# Patient Record
Sex: Female | Born: 1935 | Race: Black or African American | Hispanic: No | Marital: Married | Smoking: Former smoker
Health system: Southern US, Community
[De-identification: ages and names within clinical notes are randomized; demographics above are authoritative.]

## PROBLEM LIST (undated history)

## (undated) DIAGNOSIS — Z8719 Personal history of other diseases of the digestive system: Secondary | ICD-10-CM

## (undated) DIAGNOSIS — L84 Corns and callosities: Secondary | ICD-10-CM

## (undated) DIAGNOSIS — I639 Cerebral infarction, unspecified: Secondary | ICD-10-CM

## (undated) DIAGNOSIS — R040 Epistaxis: Secondary | ICD-10-CM

## (undated) DIAGNOSIS — R5381 Other malaise: Secondary | ICD-10-CM

## (undated) DIAGNOSIS — K219 Gastro-esophageal reflux disease without esophagitis: Secondary | ICD-10-CM

## (undated) DIAGNOSIS — R269 Unspecified abnormalities of gait and mobility: Secondary | ICD-10-CM

## (undated) DIAGNOSIS — R5383 Other fatigue: Secondary | ICD-10-CM

## (undated) DIAGNOSIS — M1990 Unspecified osteoarthritis, unspecified site: Secondary | ICD-10-CM

## (undated) DIAGNOSIS — F411 Generalized anxiety disorder: Secondary | ICD-10-CM

## (undated) DIAGNOSIS — I4891 Unspecified atrial fibrillation: Secondary | ICD-10-CM

## (undated) DIAGNOSIS — E785 Hyperlipidemia, unspecified: Secondary | ICD-10-CM

## (undated) DIAGNOSIS — I10 Essential (primary) hypertension: Secondary | ICD-10-CM

## (undated) DIAGNOSIS — R0602 Shortness of breath: Secondary | ICD-10-CM

## (undated) HISTORY — DX: Hyperlipidemia, unspecified: E78.5

## (undated) HISTORY — DX: Generalized anxiety disorder: F41.1

## (undated) HISTORY — DX: Corns and callosities: L84

## (undated) HISTORY — DX: Other fatigue: R53.83

## (undated) HISTORY — DX: Essential (primary) hypertension: I10

## (undated) HISTORY — DX: Shortness of breath: R06.02

## (undated) HISTORY — DX: Other malaise: R53.81

## (undated) HISTORY — DX: Personal history of other diseases of the digestive system: Z87.19

## (undated) HISTORY — DX: Epistaxis: R04.0

## (undated) HISTORY — DX: Gastro-esophageal reflux disease without esophagitis: K21.9

## (undated) HISTORY — PX: ABDOMINAL HYSTERECTOMY: SHX81

## (undated) HISTORY — DX: Unspecified atrial fibrillation: I48.91

## (undated) HISTORY — PX: ESOPHAGOGASTRODUODENOSCOPY: SHX1529

## (undated) HISTORY — DX: Unspecified osteoarthritis, unspecified site: M19.90

## (undated) HISTORY — DX: Unspecified abnormalities of gait and mobility: R26.9

---

## 2005-01-07 ENCOUNTER — Ambulatory Visit: Payer: Self-pay | Admitting: Internal Medicine

## 2005-01-14 ENCOUNTER — Ambulatory Visit: Payer: Self-pay | Admitting: Internal Medicine

## 2005-02-11 ENCOUNTER — Ambulatory Visit: Payer: Self-pay | Admitting: Internal Medicine

## 2005-05-11 ENCOUNTER — Ambulatory Visit: Payer: Self-pay | Admitting: Internal Medicine

## 2005-05-21 ENCOUNTER — Ambulatory Visit: Payer: Self-pay

## 2005-08-11 ENCOUNTER — Ambulatory Visit: Payer: Self-pay | Admitting: Internal Medicine

## 2005-11-10 ENCOUNTER — Ambulatory Visit: Payer: Self-pay | Admitting: Internal Medicine

## 2005-12-08 ENCOUNTER — Ambulatory Visit: Payer: Self-pay | Admitting: Internal Medicine

## 2006-03-08 ENCOUNTER — Ambulatory Visit: Payer: Self-pay | Admitting: Internal Medicine

## 2006-04-29 ENCOUNTER — Ambulatory Visit: Payer: Self-pay | Admitting: Internal Medicine

## 2006-08-09 ENCOUNTER — Ambulatory Visit: Payer: Self-pay | Admitting: Internal Medicine

## 2007-01-23 ENCOUNTER — Ambulatory Visit: Payer: Self-pay | Admitting: Internal Medicine

## 2007-02-13 ENCOUNTER — Ambulatory Visit: Payer: Self-pay | Admitting: Internal Medicine

## 2007-02-13 LAB — CONVERTED CEMR LAB
AST: 22 units/L (ref 0–37)
Albumin: 3.3 g/dL — ABNORMAL LOW (ref 3.5–5.2)
Basophils Relative: 0.6 % (ref 0.0–1.0)
Bilirubin, Direct: 0.1 mg/dL (ref 0.0–0.3)
Chloride: 102 meq/L (ref 96–112)
Creatinine, Ser: 0.7 mg/dL (ref 0.4–1.2)
Eosinophils Relative: 2.7 % (ref 0.0–5.0)
GFR calc non Af Amer: 84 mL/min
HCT: 39.8 % (ref 36.0–46.0)
MCHC: 34.2 g/dL (ref 30.0–36.0)
Monocytes Relative: 10.9 % (ref 3.0–11.0)
Neutro Abs: 2.2 10*3/uL (ref 1.4–7.7)
Platelets: 176 10*3/uL (ref 150–400)
Potassium: 4.3 meq/L (ref 3.5–5.1)
RBC: 4.43 M/uL (ref 3.87–5.11)
Sodium: 143 meq/L (ref 135–145)
TSH: 1.51 microintl units/mL (ref 0.35–5.50)
Total Bilirubin: 0.6 mg/dL (ref 0.3–1.2)

## 2007-04-17 ENCOUNTER — Ambulatory Visit: Payer: Self-pay | Admitting: Internal Medicine

## 2007-05-17 ENCOUNTER — Ambulatory Visit: Payer: Self-pay | Admitting: Internal Medicine

## 2007-08-08 DIAGNOSIS — K219 Gastro-esophageal reflux disease without esophagitis: Secondary | ICD-10-CM

## 2007-08-08 DIAGNOSIS — I10 Essential (primary) hypertension: Secondary | ICD-10-CM

## 2007-08-08 HISTORY — DX: Gastro-esophageal reflux disease without esophagitis: K21.9

## 2007-08-08 HISTORY — DX: Essential (primary) hypertension: I10

## 2007-08-18 ENCOUNTER — Ambulatory Visit: Payer: Self-pay | Admitting: Internal Medicine

## 2007-08-18 DIAGNOSIS — R0602 Shortness of breath: Secondary | ICD-10-CM

## 2007-08-18 HISTORY — DX: Shortness of breath: R06.02

## 2007-08-18 LAB — CONVERTED CEMR LAB
CO2: 32 meq/L (ref 19–32)
Calcium: 9.6 mg/dL (ref 8.4–10.5)
Creatinine, Ser: 0.7 mg/dL (ref 0.4–1.2)
GFR calc Af Amer: 102 mL/min
Glucose, Bld: 90 mg/dL (ref 70–99)
Potassium: 4.8 meq/L (ref 3.5–5.1)
Pro B Natriuretic peptide (BNP): 62 pg/mL (ref 0.0–100.0)

## 2007-09-08 ENCOUNTER — Ambulatory Visit: Payer: Self-pay | Admitting: Internal Medicine

## 2007-09-29 ENCOUNTER — Ambulatory Visit: Payer: Self-pay | Admitting: Internal Medicine

## 2007-10-13 ENCOUNTER — Telehealth: Payer: Self-pay | Admitting: Internal Medicine

## 2007-12-04 ENCOUNTER — Ambulatory Visit: Payer: Self-pay | Admitting: Internal Medicine

## 2008-02-19 ENCOUNTER — Ambulatory Visit: Payer: Self-pay | Admitting: Internal Medicine

## 2008-02-19 DIAGNOSIS — M79609 Pain in unspecified limb: Secondary | ICD-10-CM

## 2008-05-17 ENCOUNTER — Ambulatory Visit: Payer: Self-pay | Admitting: Internal Medicine

## 2008-05-17 DIAGNOSIS — M1990 Unspecified osteoarthritis, unspecified site: Secondary | ICD-10-CM

## 2008-05-17 DIAGNOSIS — F411 Generalized anxiety disorder: Secondary | ICD-10-CM | POA: Insufficient documentation

## 2008-05-17 DIAGNOSIS — R110 Nausea: Secondary | ICD-10-CM

## 2008-05-17 DIAGNOSIS — R5383 Other fatigue: Secondary | ICD-10-CM

## 2008-05-17 DIAGNOSIS — R5381 Other malaise: Secondary | ICD-10-CM

## 2008-05-17 DIAGNOSIS — E785 Hyperlipidemia, unspecified: Secondary | ICD-10-CM

## 2008-05-17 HISTORY — DX: Other malaise: R53.81

## 2008-05-17 HISTORY — DX: Generalized anxiety disorder: F41.1

## 2008-05-17 HISTORY — DX: Hyperlipidemia, unspecified: E78.5

## 2008-05-17 HISTORY — DX: Unspecified osteoarthritis, unspecified site: M19.90

## 2008-05-21 ENCOUNTER — Ambulatory Visit: Payer: Self-pay | Admitting: Internal Medicine

## 2008-05-21 LAB — CONVERTED CEMR LAB
AST: 17 units/L (ref 0–37)
Alkaline Phosphatase: 96 units/L (ref 39–117)
Basophils Relative: 0.2 % (ref 0.0–1.0)
Calcium: 9.4 mg/dL (ref 8.4–10.5)
Creatinine, Ser: 0.7 mg/dL (ref 0.4–1.2)
Eosinophils Absolute: 0 10*3/uL (ref 0.0–0.7)
Eosinophils Relative: 0.6 % (ref 0.0–5.0)
GFR calc Af Amer: 101 mL/min
GFR calc non Af Amer: 84 mL/min
Hemoglobin: 13.7 g/dL (ref 12.0–15.0)
MCHC: 32.7 g/dL (ref 30.0–36.0)
MCV: 93.3 fL (ref 78.0–100.0)
Monocytes Absolute: 0.3 10*3/uL (ref 0.1–1.0)
Neutro Abs: 2.5 10*3/uL (ref 1.4–7.7)
RBC: 4.51 M/uL (ref 3.87–5.11)
RDW: 14 % (ref 11.5–14.6)
Total Protein: 7 g/dL (ref 6.0–8.3)

## 2008-05-23 ENCOUNTER — Telehealth: Payer: Self-pay | Admitting: Internal Medicine

## 2008-08-16 ENCOUNTER — Ambulatory Visit: Payer: Self-pay | Admitting: Internal Medicine

## 2008-08-16 DIAGNOSIS — R269 Unspecified abnormalities of gait and mobility: Secondary | ICD-10-CM

## 2008-08-16 HISTORY — DX: Unspecified abnormalities of gait and mobility: R26.9

## 2008-09-26 HISTORY — PX: OTHER SURGICAL HISTORY: SHX169

## 2008-10-01 ENCOUNTER — Ambulatory Visit: Payer: Self-pay | Admitting: Internal Medicine

## 2008-10-17 ENCOUNTER — Telehealth: Payer: Self-pay | Admitting: Internal Medicine

## 2008-10-25 ENCOUNTER — Inpatient Hospital Stay (HOSPITAL_COMMUNITY): Admission: EM | Admit: 2008-10-25 | Discharge: 2008-10-30 | Payer: Self-pay | Admitting: Emergency Medicine

## 2008-10-25 ENCOUNTER — Ambulatory Visit: Payer: Self-pay | Admitting: Internal Medicine

## 2008-10-25 ENCOUNTER — Encounter: Payer: Self-pay | Admitting: Internal Medicine

## 2009-02-11 ENCOUNTER — Ambulatory Visit: Payer: Self-pay | Admitting: Internal Medicine

## 2009-02-11 DIAGNOSIS — L84 Corns and callosities: Secondary | ICD-10-CM | POA: Insufficient documentation

## 2009-02-11 HISTORY — DX: Corns and callosities: L84

## 2009-02-11 LAB — CONVERTED CEMR LAB
ALT: 8 units/L (ref 0–35)
AST: 18 units/L (ref 0–37)
Albumin: 3.5 g/dL (ref 3.5–5.2)
BUN: 14 mg/dL (ref 6–23)
Bilirubin, Direct: 0.1 mg/dL (ref 0.0–0.3)
Calcium: 9.2 mg/dL (ref 8.4–10.5)
Creatinine, Ser: 0.8 mg/dL (ref 0.4–1.2)
Eosinophils Absolute: 0.1 10*3/uL (ref 0.0–0.7)
GFR calc Af Amer: 87 mL/min
GFR calc non Af Amer: 72 mL/min
Neutro Abs: 2.3 10*3/uL (ref 1.4–7.7)
Neutrophils Relative %: 57.1 % (ref 43.0–77.0)
Platelets: 137 10*3/uL — ABNORMAL LOW (ref 150–400)
RDW: 14 % (ref 11.5–14.6)
Sodium: 144 meq/L (ref 135–145)
TSH: 1.86 microintl units/mL (ref 0.35–5.50)
Total Protein: 7.1 g/dL (ref 6.0–8.3)

## 2009-04-22 IMAGING — CR DG FEMUR 2+V*R*
2 series · 2 of 2 positions shown · non-contrast
Comparison: Hip films same date

CLINICAL DATA: Trauma.

RIGHT FEMUR - 2 VIEW

[w hip lateral right]
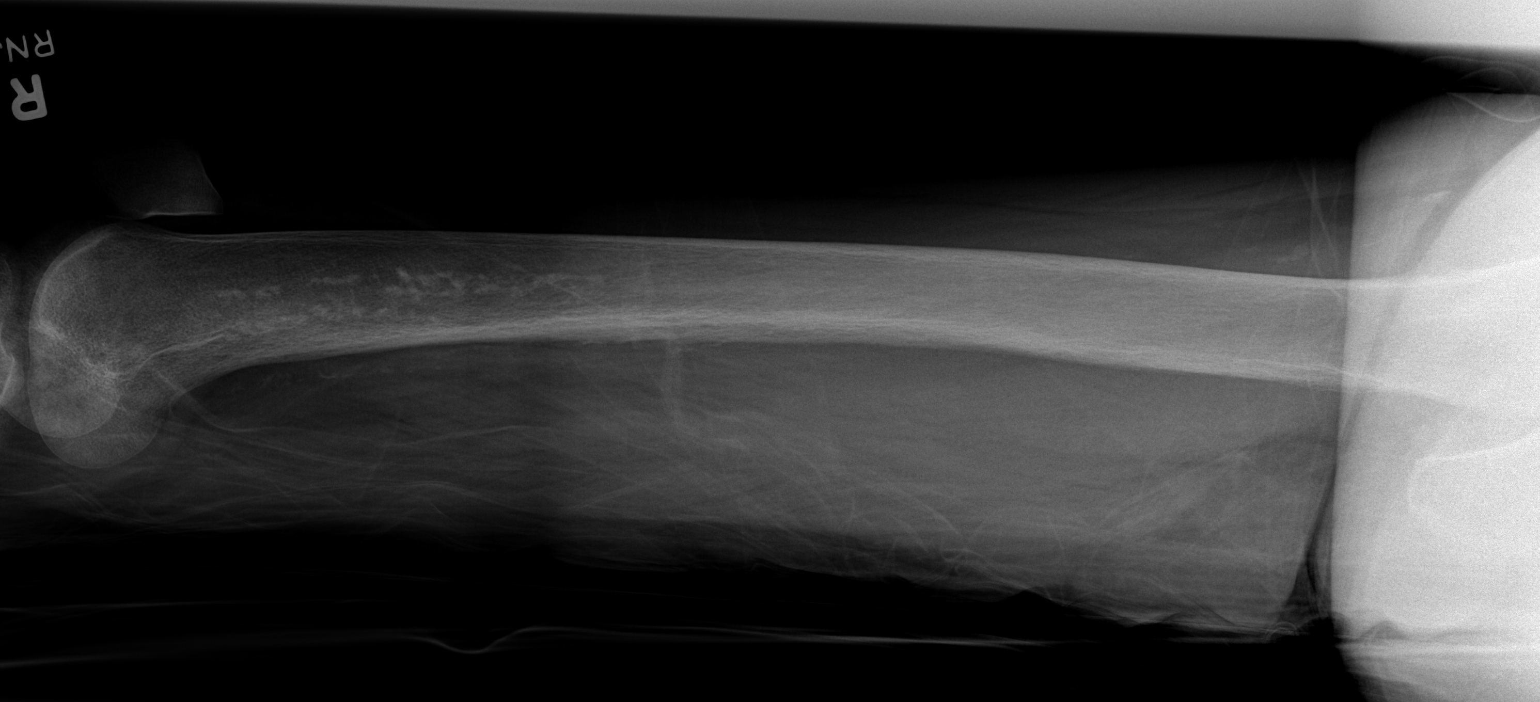

[t femur with knee ap right]
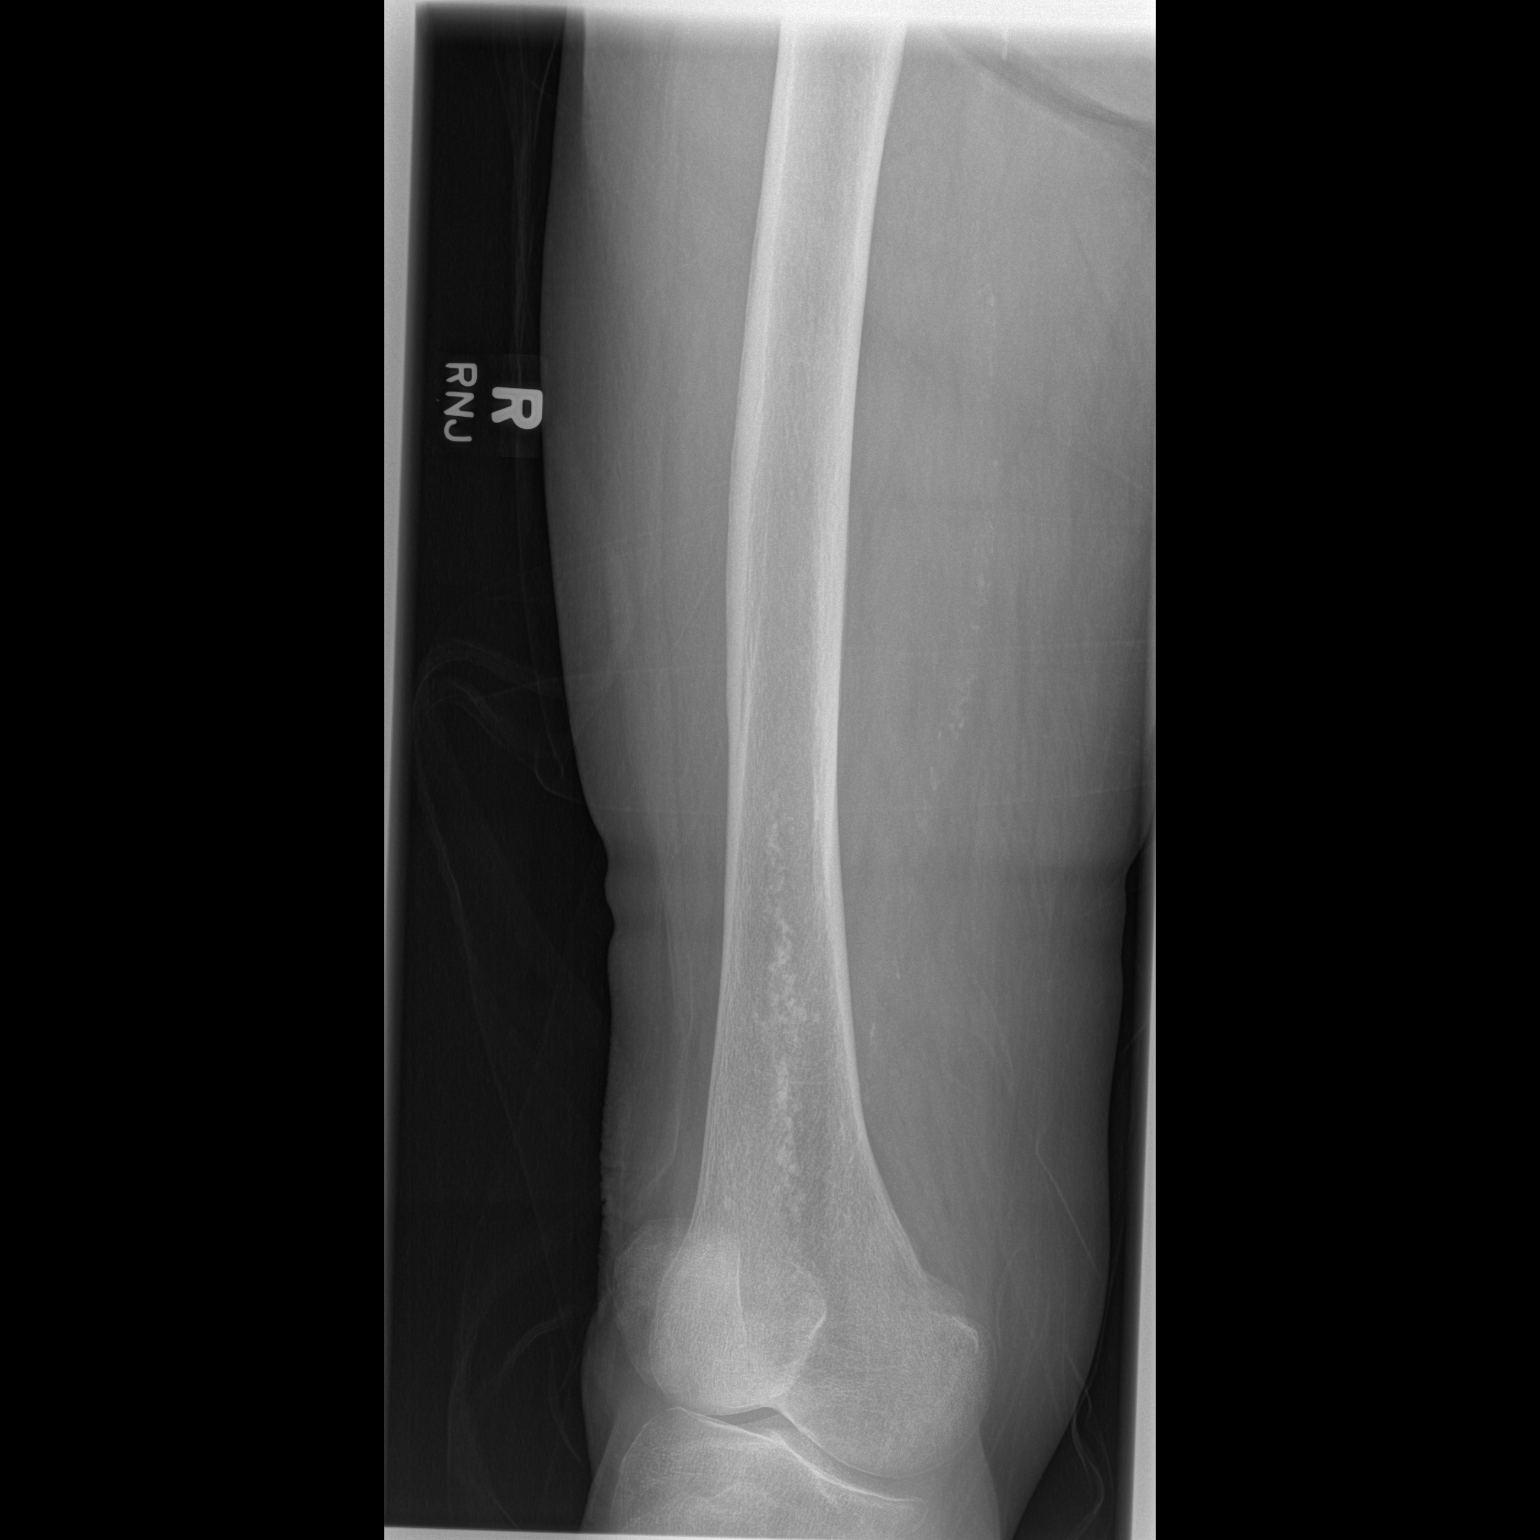

[2 of 2 positions shown; findings below may reference images not displayed]

FINDINGS: Vascular calcifications.  Mild osteopenia.  No mid to
distal femoral fracture.
IMPRESSION: 1.  No acute findings about the mid to distal right femur.  Please
see right hip films for further discussion.

## 2009-04-22 IMAGING — CR DG CHEST 1V
1 series · 1 of 1 positions shown · non-contrast
Comparison: 05/17/2008

CLINICAL DATA: Fall.  Hypertension.

CHEST - 1 VIEW

[t chest supine]
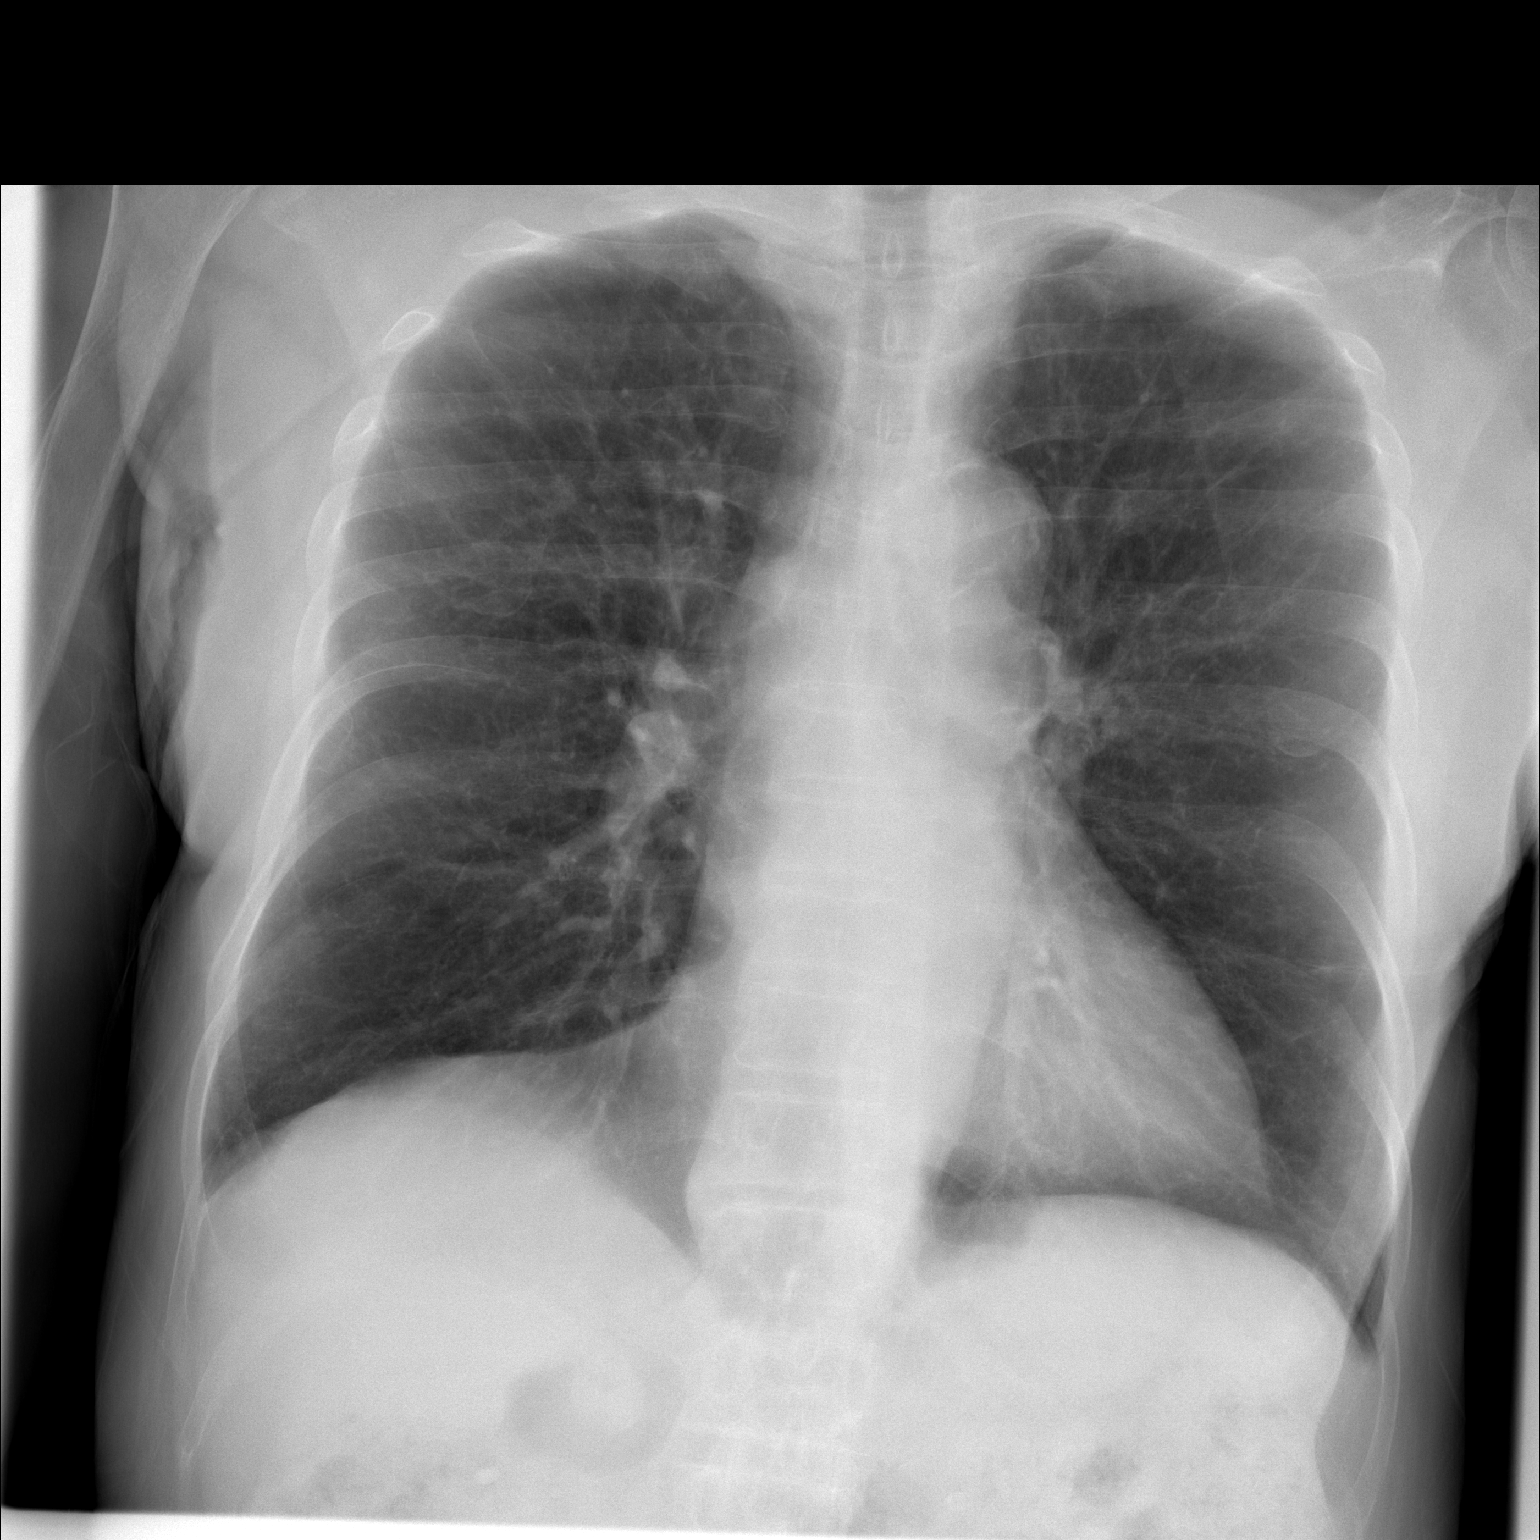

[1 of 1 positions shown; findings below may reference images not displayed]

FINDINGS: Mild osteopenia.  Apical lordotic technique. Midline
trachea. Heart size upper limits of normal.  Normal mediastinal
contours. No pleural effusion or pneumothorax. Question mild
hyperinflation. Clear lungs.
IMPRESSION: 1. No acute cardiopulmonary disease.

## 2009-08-01 ENCOUNTER — Ambulatory Visit: Payer: Self-pay | Admitting: Internal Medicine

## 2009-08-01 LAB — CONVERTED CEMR LAB
Glucose, Urine, Semiquant: NEGATIVE
Nitrite: NEGATIVE
Specific Gravity, Urine: 1.01
pH: 6.5

## 2009-10-09 ENCOUNTER — Ambulatory Visit: Payer: Self-pay | Admitting: Internal Medicine

## 2009-10-09 DIAGNOSIS — I4891 Unspecified atrial fibrillation: Secondary | ICD-10-CM

## 2009-10-09 DIAGNOSIS — N3000 Acute cystitis without hematuria: Secondary | ICD-10-CM | POA: Insufficient documentation

## 2009-10-09 DIAGNOSIS — R109 Unspecified abdominal pain: Secondary | ICD-10-CM | POA: Insufficient documentation

## 2009-10-09 HISTORY — DX: Unspecified atrial fibrillation: I48.91

## 2009-10-14 ENCOUNTER — Ambulatory Visit: Payer: Self-pay | Admitting: Internal Medicine

## 2010-01-07 ENCOUNTER — Emergency Department (HOSPITAL_COMMUNITY): Admission: EM | Admit: 2010-01-07 | Discharge: 2010-01-07 | Payer: Self-pay | Admitting: Emergency Medicine

## 2010-01-15 ENCOUNTER — Ambulatory Visit: Payer: Self-pay | Admitting: Internal Medicine

## 2010-01-15 DIAGNOSIS — R040 Epistaxis: Secondary | ICD-10-CM

## 2010-01-15 HISTORY — DX: Epistaxis: R04.0

## 2010-04-16 ENCOUNTER — Ambulatory Visit: Payer: Self-pay | Admitting: Internal Medicine

## 2010-10-19 ENCOUNTER — Ambulatory Visit: Payer: Self-pay | Admitting: Internal Medicine

## 2011-01-26 NOTE — Assessment & Plan Note (Signed)
Summary: fup up er-nosebleed//ccm   Vital Signs:  Patient profile:   77 year old female Weight:      115 pounds Temp:     97.6 degrees F oral BP sitting:   196 / 84  (left arm) Cuff size:   regular  Vitals Entered By: Raechel Ache, RN (January 15, 2010 10:52 AM) CC: ER f/u.   Primary Care Provider:  Gordy Savers  MD  CC:  ER f/u.Marland Kitchen  History of Present Illness: 77 year old patient who is seen today for follow-up.  She was evaluated at the [facility] originally due to epistaxis.  She has had two minor self-limiting episodes from the right nares.  Since her ER visit.  She misunderstood directions last visit when changes in her medical regimen were highlighted.  She has been taking the highlighted.  Medications only and has omitted.  Several blood pressure medications.  Denies any other concerns or complaints.  She does have a history of atrial fibrillation with rapid ventricular response.  Medical regimen includes aspirin only.  She has a history of dyslipidemia.  Allergies: No Known Drug Allergies  Past History:  Past Medical History: GERD: history of stricture formation Hypertension labile DJD Anxiety Osteoarthritis Hyperlipidemia gait instability atrial  fibrillation (onset October 2010)  Past Surgical History: Reviewed history from 02/11/2009 and no changes required. Hysterectomy gravida 5, para 5 abortus zero  2-D echocardiogram, May 2006 -normal esophagogastroduodenoscopy and 1983 with stricture formation, hiatal hernia and reflux esophagitis  status post right hip fracture October 2009  Review of Systems  The patient denies anorexia, fever, weight loss, weight gain, vision loss, decreased hearing, hoarseness, chest pain, syncope, dyspnea on exertion, peripheral edema, prolonged cough, headaches, hemoptysis, abdominal pain, melena, hematochezia, severe indigestion/heartburn, hematuria, incontinence, genital sores, muscle weakness, suspicious skin  lesions, transient blindness, difficulty walking, depression, unusual weight change, abnormal bleeding, enlarged lymph nodes, angioedema, and breast masses.    Physical Exam  General:  Well-developed,well-nourished,in no acute distress; alert,appropriate and cooperative throughout examination; blood pressure 190/86 Head:  Normocephalic and atraumatic without obvious abnormalities. No apparent alopecia or balding. Nose:  small crusted lesion involving her lateral right nares.  No active bleeding Mouth:  Oral mucosa and oropharynx without lesions or exudates.  Teeth in good repair. Neck:  No deformities, masses, or tenderness noted. Lungs:  Normal respiratory effort, chest expands symmetrically. Lungs are clear to auscultation, no crackles or wheezes. Heart:  Normal rate and regular rhythm. S1 and S2 normal without gallop, murmur, click, rub or other extra sounds.   Impression & Recommendations:  Problem # 1:  ATRIAL FIBRILLATION WITH RAPID VENTRICULAR RESPONSE (ICD-427.31)  Her updated medication list for this problem includes:    Amlodipine Besylate 5 Mg Tabs (Amlodipine besylate) ..... One daily    Aspirin 81 Mg Chew (Aspirin) .Marland Kitchen... 1 once daily    Metoprolol Tartrate 100 Mg Tabs (Metoprolol tartrate) ..... One twice daily  Problem # 2:  HYPERTENSION (ICD-401.9)  Her updated medication list for this problem includes:    Furosemide 40 Mg Tabs (Furosemide) ..... One daily    Benazepril Hcl 40 Mg Tabs (Benazepril hcl) ..... One daily    Amlodipine Besylate 5 Mg Tabs (Amlodipine besylate) ..... One daily    Metoprolol Tartrate 100 Mg Tabs (Metoprolol tartrate) ..... One twice daily  Problem # 3:  EPISTAXIS, RECURRENT (ICD-784.7)  Complete Medication List: 1)  Lorazepam 0.5 Mg Tabs (Lorazepam) .... Take 1 tablet by mouth once a day if needed 2)  Prevacid  30 Mg Cpdr (Lansoprazole) .... Take 1 tablet by mouth once a day if needed 3)  Furosemide 40 Mg Tabs (Furosemide) .... One daily 4)   Benazepril Hcl 40 Mg Tabs (Benazepril hcl) .... One daily 5)  Amlodipine Besylate 5 Mg Tabs (Amlodipine besylate) .... One daily 6)  Proair Hfa 108 (90 Base) Mcg/act Aers (Albuterol sulfate) .... 2 puffs every 6 hours as needed 7)  Aspirin 81 Mg Chew (Aspirin) .Marland Kitchen.. 1 once daily 8)  Ciprofloxacin Hcl 500 Mg Tabs (Ciprofloxacin hcl) .... One twice daily 9)  Metoprolol Tartrate 100 Mg Tabs (Metoprolol tartrate) .... One twice daily  Patient Instructions: 1)  Please schedule a follow-up appointment in 3 months. 2)  Limit your Sodium (Salt). 3)  It is important that you exercise regularly at least 20 minutes 5 times a week. If you develop chest pain, have severe difficulty breathing, or feel very tired , stop exercising immediately and seek medical attention. Prescriptions: METOPROLOL TARTRATE 100 MG TABS (METOPROLOL TARTRATE) one twice daily  #180 x 6   Entered and Authorized by:   Gordy Savers  MD   Signed by:   Gordy Savers  MD on 01/15/2010   Method used:   Print then Give to Patient   RxID:   [id number] PROAIR HFA 108 (90 BASE) MCG/ACT  AERS (ALBUTEROL SULFATE) 2 puffs every 6 hours as needed  #3 x 6   Entered and Authorized by:   Gordy Savers  MD   Signed by:   Gordy Savers  MD on 01/15/2010   Method used:   Print then Give to Patient   RxID:   [id number] AMLODIPINE BESYLATE 5 MG  TABS (AMLODIPINE BESYLATE) one daily  #90 x 6   Entered and Authorized by:   Gordy Savers  MD   Signed by:   Gordy Savers  MD on 01/15/2010   Method used:   Print then Give to Patient   RxID:   [id number] BENAZEPRIL HCL 40 MG  TABS (BENAZEPRIL HCL) one daily  #90 x 6   Entered and Authorized by:   Gordy Savers  MD   Signed by:   Gordy Savers  MD on 01/15/2010   Method used:   Print then Give to Patient   RxID:   [id number] FUROSEMIDE 40 MG  TABS (FUROSEMIDE) one daily  #90 x 6   Entered and Authorized by:   Gordy Savers   MD   Signed by:   Gordy Savers  MD on 01/15/2010   Method used:   Print then Give to Patient   RxID:   [id number] PREVACID 30 MG  CPDR (LANSOPRAZOLE) Take 1 tablet by mouth once a day if needed  #50 x 4   Entered and Authorized by:   Gordy Savers  MD   Signed by:   Gordy Savers  MD on 01/15/2010   Method used:   Print then Give to Patient   RxID:   [id number] LORAZEPAM 0.5 MG  TABS (LORAZEPAM) Take 1 tablet by mouth once a day if needed  #50 x 4   Entered and Authorized by:   Gordy Savers  MD   Signed by:   Gordy Savers  MD on 01/15/2010   Method used:   Print then Give to Patient   RxID:   [id number]

## 2011-01-26 NOTE — Miscellaneous (Signed)
Summary: removed cipro  Clinical Lists Changes  Medications: Removed medication of CIPROFLOXACIN HCL 500 MG TABS (CIPROFLOXACIN HCL) one twice daily

## 2011-01-26 NOTE — Assessment & Plan Note (Signed)
Summary: Follow up/flu shot/cb   Vital Signs:  Patient profile:   77 year old female Height:      62 inches Weight:      116 pounds BMI:     21.29 Temp:     98.2 degrees F oral Pulse rate:   80 / minute Pulse rhythm:   irregular Resp:     14 per minute BP sitting:   180 / 80  (left arm)  Vitals Entered By: Willy Eddy, LPN (October 19, 2010 10:43 AM) CC: roa Is Patient Diabetic? No   Primary Care Sahithi Ordoyne:  Gordy Savers  MD  CC:  roa.  History of Present Illness: 77 year  old patient who is seen today for follow-up.  She has a history of  treated hypertension, and paroxysmal atrial fibrillation.  She does remarkably well at 77 years of age.  She is on multiple blood pressure medications.  Denies any palpitations or symptoms of congestive heart failure.  No chest pain. She has a history of gastroenteritis, which has been stable.  She has a history of gait instability  Preventive Screening-Counseling & Management  Alcohol-Tobacco     Smoking Status: quit     Passive Smoke Exposure: no  Current Problems (verified): 1)  Epistaxis, Recurrent  (ICD-784.7) 2)  Acute Cystitis  (ICD-595.0) 3)  Atrial Fibrillation With Rapid Ventricular Response  (ICD-427.31) 4)  Suprapubic Pain  (ICD-789.09) 5)  Callus, Right Foot  (ICD-700) 6)  Gait Imbalance  (ICD-781.2) 7)  Nausea  (ICD-787.02) 8)  Weakness  (ICD-780.79) 9)  Hyperlipidemia  (ICD-272.4) 10)  Osteoarthritis  (ICD-715.90) 11)  Anxiety  (ICD-300.00) 12)  Calf Pain, Bilateral  (ICD-729.5) 13)  Sob  (ICD-786.05) 14)  Hypertension  (ICD-401.9) 15)  Gerd  (ICD-530.81)  Allergies: No Known Drug Allergies  Past History:  Past Medical History: Reviewed history from 01/15/2010 and no changes required. GERD: history of stricture formation Hypertension labile DJD Anxiety Osteoarthritis Hyperlipidemia gait instability atrial  fibrillation (onset October 2010)  Past Surgical History: Reviewed history from  02/11/2009 and no changes required. Hysterectomy gravida 5, para 5 abortus zero  2-D echocardiogram, May 2006 -normal esophagogastroduodenoscopy and 1983 with stricture formation, hiatal hernia and reflux esophagitis  status post right hip fracture October 2009  Family History: Reviewed history from 05/17/2008 and no changes required. Family History Hypertension father died in his 95s, unclear cause.  Mother died at 72 bone the hip fracture   one sister no family history of cancer  Review of Systems       The patient complains of muscle weakness and difficulty walking.  The patient denies anorexia, fever, weight loss, weight gain, vision loss, decreased hearing, hoarseness, chest pain, syncope, dyspnea on exertion, peripheral edema, prolonged cough, headaches, hemoptysis, abdominal pain, melena, hematochezia, severe indigestion/heartburn, hematuria, incontinence, genital sores, suspicious skin lesions, transient blindness, depression, unusual weight change, abnormal bleeding, enlarged lymph nodes, angioedema, and breast masses.         Flu Vaccine Consent Questions     Do you have a history of severe allergic reactions to this vaccine? no    Any prior history of allergic reactions to egg and/or gelatin? no    Do you have a sensitivity to the preservative Thimersol? no    Do you have a past history of Guillan-Barre Syndrome? no    Do you currently have an acute febrile illness? no    Have you ever had a severe reaction to latex? no  Vaccine information given and explained to patient? yes    Are you currently pregnant? no    Lot Number:[id number]   Exp Date:06/26/2011   Site Given  Left Deltoid IM   Physical Exam  General:  Well-developed,well-nourished,in no acute distress; alert,appropriate and cooperative throughout examination; 160/74 Head:  Normocephalic and atraumatic without obvious abnormalities. No apparent alopecia or balding. Mouth:  Oral mucosa and oropharynx  without lesions or exudates.  Teeth in good repair. Neck:  No deformities, masses, or tenderness noted. Lungs:  Normal respiratory effort, chest expands symmetrically. Lungs are clear to auscultation, no crackles or wheezes. Heart:  occasional ectopics probable normal sinus rhythm rate 70 to 80 Abdomen:  Bowel sounds positive,abdomen soft and non-tender without masses, organomegaly or hernias noted. Msk:  No deformity or scoliosis noted of thoracic or lumbar spine.   osteoarthritic changes only Extremities:  .  No edema Skin:  Intact without suspicious lesions or rashes Cervical Nodes:  No lymphadenopathy noted   Impression & Recommendations:  Problem # 1:  HYPERTENSION (ICD-401.9)  Her updated medication list for this problem includes:    Furosemide 40 Mg Tabs (Furosemide) ..... One daily    Benazepril Hcl 40 Mg Tabs (Benazepril hcl) ..... One daily    Amlodipine Besylate 5 Mg Tabs (Amlodipine besylate) ..... One daily    Metoprolol Tartrate 100 Mg Tabs (Metoprolol tartrate) ..... One twice daily  Her updated medication list for this problem includes:    Furosemide 40 Mg Tabs (Furosemide) ..... One daily    Benazepril Hcl 40 Mg Tabs (Benazepril hcl) ..... One daily    Amlodipine Besylate 5 Mg Tabs (Amlodipine besylate) ..... One daily    Metoprolol Tartrate 100 Mg Tabs (Metoprolol tartrate) ..... One twice daily  Problem # 2:  OSTEOARTHRITIS (ICD-715.90)  Her updated medication list for this problem includes:    Aspirin 81 Mg Chew (Aspirin) .Marland Kitchen... 1 once daily  Her updated medication list for this problem includes:    Aspirin 81 Mg Chew (Aspirin) .Marland Kitchen... 1 once daily  Problem # 3:  ATRIAL FIBRILLATION WITH RAPID VENTRICULAR RESPONSE (ICD-427.31)  Her updated medication list for this problem includes:    Amlodipine Besylate 5 Mg Tabs (Amlodipine besylate) ..... One daily    Aspirin 81 Mg Chew (Aspirin) .Marland Kitchen... 1 once daily    Metoprolol Tartrate 100 Mg Tabs (Metoprolol tartrate)  ..... One twice daily  Her updated medication list for this problem includes:    Amlodipine Besylate 5 Mg Tabs (Amlodipine besylate) ..... One daily    Aspirin 81 Mg Chew (Aspirin) .Marland Kitchen... 1 once daily    Metoprolol Tartrate 100 Mg Tabs (Metoprolol tartrate) ..... One twice daily  patient probably is a normal sinus rhythm today, but does have frequent ectopics;  in view of her weakness and gait instability.  Doubtful candidate for full anticoagulation.  Will reassess in the spring  Complete Medication List: 1)  Lorazepam 0.5 Mg Tabs (Lorazepam) .... Take 1 tablet by mouth once a day if needed 2)  Prevacid 30 Mg Cpdr (Lansoprazole) .... Take 1 tablet by mouth once a day if needed 3)  Furosemide 40 Mg Tabs (Furosemide) .... One daily 4)  Benazepril Hcl 40 Mg Tabs (Benazepril hcl) .... One daily 5)  Amlodipine Besylate 5 Mg Tabs (Amlodipine besylate) .... One daily 6)  Proair Hfa 108 (90 Base) Mcg/act Aers (Albuterol sulfate) .... 2 puffs every 6 hours as needed 7)  Aspirin 81 Mg Chew (Aspirin) .Marland Kitchen.. 1 once daily 8)  Metoprolol Tartrate  100 Mg Tabs (Metoprolol tartrate) .... One twice daily  Other Orders: Flu Vaccine 87yrs + MEDICARE PATIENTS (F6213) Administration Flu vaccine - MCR (Y8657)  Patient Instructions: 1)  Please schedule a follow-up appointment in 6 months. 2)  Limit your Sodium (Salt). 3)  It is important that you exercise regularly at least 20 minutes 5 times a week. If you develop chest pain, have severe difficulty breathing, or feel very tired , stop exercising immediately and seek medical attention. and I will go R. and  Orders Added: 1)  Flu Vaccine 48yrs + MEDICARE PATIENTS [Q2039] 2)  Administration Flu vaccine - MCR [G0008] 3)  Est. Patient Level IV [84696]

## 2011-01-26 NOTE — Assessment & Plan Note (Signed)
Summary: 3 mo rov/mm   Vital Signs:  Patient profile:   77 year old female Weight:      116 pounds O2 Sat:      95 % on Room air Temp:     97.6 degrees F oral BP sitting:   140 / 90  (right arm) Cuff size:   regular  Vitals Entered By: Duard Brady LPN (April 16, 2010 10:02 AM)  O2 Flow:  Room air CC: 3 mos rov - doing well Is Patient Diabetic? No   Primary Care Provider:  Gordy Savers  MD  CC:  3 mos rov - doing well.  History of Present Illness: 77 year old patient has a history of atrial fibrillation.  She has done quite well.  The risks versus benefits of Coumadin therapy.  Again discussed, and it was felt that aspirin remains her best option.  She is doing quite well.  She has treated hypertension.  She has arthritis and gastroesophageal reflux disease.  She denies any cardiopulmonary complaints.  She states she has occasional mild wheezing that is well-controlled with pro air  Allergies (verified): No Known Drug Allergies  Past History:  Past Medical History: Reviewed history from 01/15/2010 and no changes required. GERD: history of stricture formation Hypertension labile DJD Anxiety Osteoarthritis Hyperlipidemia gait instability atrial  fibrillation (onset October 2010)  Family History: Reviewed history from 05/17/2008 and no changes required. Family History Hypertension father died in his 16s, unclear cause.  Mother died at 49 bone the hip fracture   one sister no family history of cancer  Review of Systems  The patient denies anorexia, fever, weight loss, weight gain, vision loss, decreased hearing, hoarseness, chest pain, syncope, dyspnea on exertion, peripheral edema, prolonged cough, headaches, hemoptysis, abdominal pain, melena, hematochezia, severe indigestion/heartburn, hematuria, incontinence, genital sores, muscle weakness, suspicious skin lesions, transient blindness, difficulty walking, depression, unusual weight change, abnormal  bleeding, enlarged lymph nodes, angioedema, and breast masses.    Physical Exam  General:  elderly alert, in no distress.  Blood pressure 140/82 Head:  Normocephalic and atraumatic without obvious abnormalities. No apparent alopecia or balding. Eyes:  No corneal or conjunctival inflammation noted. EOMI. Perrla. Funduscopic exam benign, without hemorrhages, exudates or papilledema. Vision grossly normal. Mouth:  Oral mucosa and oropharynx without lesions or exudates.  Teeth in good repair. Neck:  No deformities, masses, or tenderness noted. Lungs:  bibasilar crackles noted Heart:  irregular rhythm, with a controlled ventricular response Abdomen:  Bowel sounds positive,abdomen soft and non-tender without masses, organomegaly or hernias noted. Msk:  No deformity or scoliosis noted of thoracic or lumbar spine.   Extremities:  no edema   Impression & Recommendations:  Problem # 1:  ATRIAL FIBRILLATION WITH RAPID VENTRICULAR RESPONSE (ICD-427.31)  Her updated medication list for this problem includes:    Amlodipine Besylate 5 Mg Tabs (Amlodipine besylate) ..... One daily    Aspirin 81 Mg Chew (Aspirin) .Marland Kitchen... 1 once daily    Metoprolol Tartrate 100 Mg Tabs (Metoprolol tartrate) ..... One twice daily  Her updated medication list for this problem includes:    Amlodipine Besylate 5 Mg Tabs (Amlodipine besylate) ..... One daily    Aspirin 81 Mg Chew (Aspirin) .Marland Kitchen... 1 once daily    Metoprolol Tartrate 100 Mg Tabs (Metoprolol tartrate) ..... One twice daily  Problem # 2:  GAIT IMBALANCE (ICD-781.2)  Problem # 3:  OSTEOARTHRITIS (ICD-715.90)  Her updated medication list for this problem includes:    Aspirin 81 Mg Chew (Aspirin) .Marland KitchenMarland KitchenMarland KitchenMarland Kitchen 1  once daily  Her updated medication list for this problem includes:    Aspirin 81 Mg Chew (Aspirin) .Marland Kitchen... 1 once daily  Problem # 4:  HYPERTENSION (ICD-401.9)  Her updated medication list for this problem includes:    Furosemide 40 Mg Tabs (Furosemide)  ..... One daily    Benazepril Hcl 40 Mg Tabs (Benazepril hcl) ..... One daily    Amlodipine Besylate 5 Mg Tabs (Amlodipine besylate) ..... One daily    Metoprolol Tartrate 100 Mg Tabs (Metoprolol tartrate) ..... One twice daily  Her updated medication list for this problem includes:    Furosemide 40 Mg Tabs (Furosemide) ..... One daily    Benazepril Hcl 40 Mg Tabs (Benazepril hcl) ..... One daily    Amlodipine Besylate 5 Mg Tabs (Amlodipine besylate) ..... One daily    Metoprolol Tartrate 100 Mg Tabs (Metoprolol tartrate) ..... One twice daily  Complete Medication List: 1)  Lorazepam 0.5 Mg Tabs (Lorazepam) .... Take 1 tablet by mouth once a day if needed 2)  Prevacid 30 Mg Cpdr (Lansoprazole) .... Take 1 tablet by mouth once a day if needed 3)  Furosemide 40 Mg Tabs (Furosemide) .... One daily 4)  Benazepril Hcl 40 Mg Tabs (Benazepril hcl) .... One daily 5)  Amlodipine Besylate 5 Mg Tabs (Amlodipine besylate) .... One daily 6)  Proair Hfa 108 (90 Base) Mcg/act Aers (Albuterol sulfate) .... 2 puffs every 6 hours as needed 7)  Aspirin 81 Mg Chew (Aspirin) .Marland Kitchen.. 1 once daily 8)  Ciprofloxacin Hcl 500 Mg Tabs (Ciprofloxacin hcl) .... One twice daily 9)  Metoprolol Tartrate 100 Mg Tabs (Metoprolol tartrate) .... One twice daily  Patient Instructions: 1)  Please schedule a follow-up appointment in 6 months. 2)  Limit your Sodium (Salt). 3)  It is important that you exercise regularly at least 20 minutes 5 times a week. If you develop chest pain, have severe difficulty breathing, or feel very tired , stop exercising immediately and seek medical attention. Prescriptions: METOPROLOL TARTRATE 100 MG TABS (METOPROLOL TARTRATE) one twice daily  #180 x 6   Entered and Authorized by:   Gordy Savers  MD   Signed by:   Gordy Savers  MD on 04/16/2010   Method used:   Print then Give to Patient   RxID:   [id number] PROAIR HFA 108 (90 BASE) MCG/ACT  AERS (ALBUTEROL SULFATE) 2 puffs  every 6 hours as needed  #3 x 6   Entered and Authorized by:   Gordy Savers  MD   Signed by:   Gordy Savers  MD on 04/16/2010   Method used:   Print then Give to Patient   RxID:   [id number] AMLODIPINE BESYLATE 5 MG  TABS (AMLODIPINE BESYLATE) one daily  #90 x 6   Entered and Authorized by:   Gordy Savers  MD   Signed by:   Gordy Savers  MD on 04/16/2010   Method used:   Print then Give to Patient   RxID:   [id number] BENAZEPRIL HCL 40 MG  TABS (BENAZEPRIL HCL) one daily  #90 x 6   Entered and Authorized by:   Gordy Savers  MD   Signed by:   Gordy Savers  MD on 04/16/2010   Method used:   Print then Give to Patient   RxID:   [id number] FUROSEMIDE 40 MG  TABS (FUROSEMIDE) one daily  #90 x 6   Entered and Authorized by:   Gordy Savers  MD  Signed by:   Gordy Savers  MD on 04/16/2010   Method used:   Print then Give to Patient   RxID:   [id number] PREVACID 30 MG  CPDR (LANSOPRAZOLE) Take 1 tablet by mouth once a day if needed  #50 x 4   Entered and Authorized by:   Gordy Savers  MD   Signed by:   Gordy Savers  MD on 04/16/2010   Method used:   Print then Give to Patient   RxID:   [id number] LORAZEPAM 0.5 MG  TABS (LORAZEPAM) Take 1 tablet by mouth once a day if needed  #50 x 4   Entered and Authorized by:   Gordy Savers  MD   Signed by:   Gordy Savers  MD on 04/16/2010   Method used:   Print then Give to Patient   RxID:   [id number]

## 2011-04-16 ENCOUNTER — Encounter: Payer: Self-pay | Admitting: Internal Medicine

## 2011-04-21 ENCOUNTER — Ambulatory Visit: Payer: Self-pay | Admitting: Internal Medicine

## 2011-04-27 ENCOUNTER — Ambulatory Visit (INDEPENDENT_AMBULATORY_CARE_PROVIDER_SITE_OTHER): Payer: MEDICARE | Admitting: Internal Medicine

## 2011-04-27 ENCOUNTER — Encounter: Payer: Self-pay | Admitting: Internal Medicine

## 2011-04-27 DIAGNOSIS — M1990 Unspecified osteoarthritis, unspecified site: Secondary | ICD-10-CM

## 2011-04-27 DIAGNOSIS — I10 Essential (primary) hypertension: Secondary | ICD-10-CM

## 2011-04-27 DIAGNOSIS — E785 Hyperlipidemia, unspecified: Secondary | ICD-10-CM

## 2011-04-27 LAB — LIPID PANEL
Cholesterol: 162 mg/dL (ref 0–200)
HDL: 52.8 mg/dL (ref >39.00)
LDL Cholesterol: 98 mg/dL (ref 0–99)
Total CHOL/HDL Ratio: 3
Triglycerides: 54 mg/dL (ref 0.0–149.0)

## 2011-04-27 LAB — HEPATIC FUNCTION PANEL
ALT: 13 U/L (ref 0–35)
AST: 19 U/L (ref 0–37)
Albumin: 3.6 g/dL (ref 3.5–5.2)
Alkaline Phosphatase: 89 U/L (ref 39–117)
Bilirubin, Direct: 0.1 mg/dL (ref 0.0–0.3)
Total Bilirubin: 0.5 mg/dL (ref 0.3–1.2)

## 2011-04-27 LAB — CBC WITH DIFFERENTIAL/PLATELET
Basophils Absolute: 0 10*3/uL (ref 0.0–0.1)
Basophils Relative: 0.3 % (ref 0.0–3.0)
Eosinophils Absolute: 0.1 10*3/uL (ref 0.0–0.7)
Monocytes Absolute: 0.4 10*3/uL (ref 0.1–1.0)
Monocytes Relative: 8.3 % (ref 3.0–12.0)
Neutro Abs: 2.8 10*3/uL (ref 1.4–7.7)
Neutrophils Relative %: 62.9 % (ref 43.0–77.0)
RDW: 14.8 % — ABNORMAL HIGH (ref 11.5–14.6)
WBC: 4.5 10*3/uL (ref 4.5–10.5)

## 2011-04-27 LAB — BASIC METABOLIC PANEL
BUN: 12 mg/dL (ref 6–23)
CO2: 33 mEq/L — ABNORMAL HIGH (ref 19–32)
Chloride: 106 mEq/L (ref 96–112)

## 2011-04-27 MED ORDER — LORAZEPAM 0.5 MG PO TABS: 0.5000 mg | ORAL_TABLET | Freq: Every day | ORAL | Status: DC | PRN

## 2011-04-27 MED ORDER — FUROSEMIDE 40 MG PO TABS: 40.0000 mg | ORAL_TABLET | Freq: Every day | ORAL | Status: DC

## 2011-04-27 MED ORDER — BENAZEPRIL HCL 40 MG PO TABS: 40.0000 mg | ORAL_TABLET | Freq: Every day | ORAL | Status: DC

## 2011-04-27 MED ORDER — LANSOPRAZOLE 30 MG PO CPDR: 30.0000 mg | DELAYED_RELEASE_CAPSULE | Freq: Every day | ORAL | Status: DC | PRN

## 2011-04-27 MED ORDER — METOPROLOL TARTRATE 100 MG PO TABS: 100.0000 mg | ORAL_TABLET | Freq: Two times a day (BID) | ORAL | Status: DC

## 2011-04-27 MED ORDER — AMLODIPINE BESYLATE 5 MG PO TABS: 5.0000 mg | ORAL_TABLET | Freq: Every day | ORAL | Status: DC

## 2011-04-27 NOTE — Progress Notes (Signed)
  Subjective:    Patient ID: Cynthia Graham, female    DOB: 1917/01/28, 77 y.o.   MRN: [id number]  HPI  77 year old patient who is in today for followup. She has a history of osteoarthritis hypertension and dyslipidemia. She is on multiple blood pressure medications with nice control. Her only complaint today is some mild lower abdominal pain. There's been no dysuria pain is intermittent. She is unable to give a urine specimen today. She denies any cardiopulmonary complaints. Her weight has been stable. She does have a history of atrial fibrillation but denies any palpitations or any cardiopulmonary complaints.    Review of Systems  Constitutional: Negative.   HENT: Negative for hearing loss, congestion, sore throat, rhinorrhea, dental problem, sinus pressure and tinnitus.   Eyes: Negative for pain, discharge and visual disturbance.  Respiratory: Negative for cough and shortness of breath.   Cardiovascular: Negative for chest pain, palpitations and leg swelling.  Gastrointestinal: Negative for nausea, vomiting, abdominal pain, diarrhea, constipation, blood in stool and abdominal distention.  Genitourinary: Negative for dysuria, urgency, frequency, hematuria, flank pain, vaginal bleeding, vaginal discharge, difficulty urinating, vaginal pain and pelvic pain.  Musculoskeletal: Negative for joint swelling, arthralgias and gait problem.  Skin: Negative for rash.  Neurological: Negative for dizziness, syncope, speech difficulty, weakness, numbness and headaches.  Hematological: Negative for adenopathy.  Psychiatric/Behavioral: Negative for behavioral problems, dysphoric mood and agitation. The patient is not nervous/anxious.        Objective:   Physical Exam  Constitutional: She is oriented to person, place, and time. She appears well-developed and well-nourished.  HENT:  Head: Normocephalic.  Right Ear: External ear normal.  Left Ear: External ear normal.  Mouth/Throat: Oropharynx is clear and  moist.  Eyes: Conjunctivae and EOM are normal. Pupils are equal, round, and reactive to light.  Neck: Normal range of motion. Neck supple. No thyromegaly present.  Cardiovascular: Normal rate, regular rhythm and normal heart sounds.   Pulmonary/Chest: Effort normal and breath sounds normal.  Abdominal: Soft. Bowel sounds are normal. She exhibits no mass. There is no tenderness.  Musculoskeletal: Normal range of motion.  Lymphadenopathy:    She has no cervical adenopathy.  Neurological: She is alert and oriented to person, place, and time.  Skin: Skin is warm and dry. No rash noted.  Psychiatric: She has a normal mood and affect. Her behavior is normal.          Assessment & Plan:   Hypertension well controlled History of dyslipidemia Osteoarthritis  We'll check screening lab All medications refilled We'll recheck in 6 months

## 2011-04-27 NOTE — Patient Instructions (Signed)
Limit your sodium (Salt) intake  Return in 6 months for follow-up  

## 2011-04-27 NOTE — Progress Notes (Signed)
  Subjective:    Patient ID: Cynthia Graham, female    DOB: [birth year], 77 y.o.   MRN: [id number]  HPI  Wt Readings from Last 3 Encounters:  04/27/11 116 lb (52.617 kg)  10/19/10 116 lb (52.617 kg)  04/16/10 116 lb (52.617 kg)      Review of Systems     Objective:   Physical Exam        Assessment & Plan:

## 2011-05-11 NOTE — H&P (Signed)
Cynthia Graham, Cynthia Graham                  ACCOUNT NO.:  [id number]   MEDICAL RECORD NO.:  [id number]          PATIENT TYPE:  EMS   LOCATION:  ED                           FACILITY:  [facility]   PHYSICIAN:  Erasmo Leventhal, M.D.DATE OF BIRTH:  Sep 25, 1917   DATE OF ADMISSION:  10/25/2008  DATE OF DISCHARGE:                              HISTORY & PHYSICAL   CHIEF COMPLAINT:  Right hip fracture.   HISTORY OF PRESENT ILLNESS:  This 77 year old lady with a history of a  fall this evening tried to get into a car with pain and difficulty  ambulating on her right hip.  She was brought to [facility] emergency  room where she was seen and evaluated. X-rays showed a displaced femoral  neck fracture.  After discussion of the injury with the patient and her  family she will now be admitted for hemiarthroplasty of her right hip.  The surgery risks and benefits were discussed with the patient,  questions invited and answered.  Will obtain medical consult with  [facility] hospitalist for medical management of her hypertension  postoperatively.   PAST MEDICAL HISTORY:   DRUG ALLERGIES:  None.   CURRENT MEDICATIONS:  Metoprolol 50 mg b.i.d., lorazepam 0.5 mg b.i.d.  p.r.n. Prevacid 30 mg p.o. daily, Lasix 40 mg p.o. daily, benazepril 40  mg p.o. daily, amlodipine 5 mg p.o. daily, ProAir HFA 2 puffs q.6h.  p.r.n., and promethazine 25 mg p.o. q.6h. p.r.n. nausea.   Serious medical illnesses include hypertension and anxiety.  Previous  surgeries include hysterectomy.   FAMILY HISTORY:  Positive for hypertension.   SOCIAL HISTORY:  The patient is widowed.  She lives at home with her  daughter.  She previously smoked but quit 20 years ago and does not  smoke currently and not does not drink.   REVIEW OF SYSTEMS:  CENTRAL NERVOUS SYSTEM: Negative headache, blurred  vision, or dizziness.  PULMONARY:  Positive for exertional shortness of  breath. Negative for PND and orthopnea.  CARDIOVASCULAR:  Positive  for  history of occasional irregular heartbeat.  GI: Negative for ulcers,  hepatitis.  GU: Negative for urinary tract difficulty.  MUSCULOSKELETAL:  Positive as in the HPI.   PHYSICAL EXAM:  VITAL SIGNS:  BP 190/120, respirations 14, pulse 88 and  regular.  GENERAL APPEARANCE:  This is a well-developed, well-nourished, thin lady  in mild distress with her right hip.  HEENT: Head normocephalic and atraumatic.  Pupils equal, round, reactive  to light.  Throat without injection.  NECK:  Supple without adenopathy.  Carotids 2+ without bruit.  CHEST: Clear to auscultation.  No rales or rhonchi.  Respirations 14.  HEART:  Slightly irregular extra beat with no murmur.  Underlying rhythm  is a normal sinus rhythm.  ABDOMEN:  The abdomen is soft with active bowel sounds.  No masses or  organomegaly.  No pain to AP or lateral pelvic compression.  No pain to  AP or lateral chest or rib compression.  NEUROLOGIC:  Patient is alert and oriented to time, place and person.  Cranial nerves II-XII are grossly  intact.  EXTREMITIES:  Within normal  limits with the exception of the right hip which is slightly flexed and  externally rotated.  She has pain to any range of motion of the hip.  Dorsalis pedis and posterior tibialis pulses are 2+.   X-rays show displaced femoral neck fracture of the right hip.   IMPRESSION:  Displaced femoral neck fracture of the right hip.   PLAN:  Admit and medical consult for management of hypertension and  hemiarthroplasty right hip.      Cynthia Graham, P.A.    ______________________________  Erasmo Leventhal, M.D.    SJC/MEDQ  D:  10/25/2008  T:  10/25/2008  Job:  [id number]

## 2011-05-11 NOTE — Op Note (Signed)
NAMEPERRIS, TRIPATHI                  ACCOUNT NO.:  [id number]   MEDICAL RECORD NO.:  [id number]          PATIENT TYPE:  INP   LOCATION:  1613                         FACILITY:  [facility]   PHYSICIAN:  Madlyn Frankel. Charlann Boxer, M.D.  DATE OF BIRTH:  [birth year]   DATE OF PROCEDURE:  10/27/2008  DATE OF DISCHARGE:                               OPERATIVE REPORT   PREOPERATIVE DIAGNOSIS:  Displaced right femoral neck fracture.   POSTOPERATIVE DIAGNOSIS:  Displaced right femoral neck fracture.   PROCEDURE:  Right hip hemiarthroplasty utilizing DePuy Trilock press-fit  stem size 3 standard, a 45 unipolar ball and a -3 adapter.   SURGEON:  Madlyn Frankel. Charlann Boxer, M.D.   ASSISTANT:  Surgical tech.   ANESTHESIA:  General.   ESTIMATED BLOOD LOSS:  200 mL.   DRAINS:  One Hemovac.   COMPLICATIONS:  None.   SPECIMENS:  None.   INDICATIONS FOR PROCEDURE:  Ms. Qadri is an 77 year old female in decent  medical condition other than some hypertension.  She unfortunately fell  at home landing on her right side.  Brought to the emergency room where  radiographs revealed a fracture of the femoral neck.  She was admitted  to the orthopedic service with medical consultation for management of  perioperative hypertension issues.  Risks and benefits of the procedure  were discussed and consent was obtained.   PROCEDURE IN DETAIL:  The patient was brought to the operating theater.  Once adequate anesthesia, preoperative antibiotics, Ancef administered,  the patient was positioned in the left lateral decubitus position with  right side up.  The right lower extremity was pre scrubbed then prepped  and draped in a sterile fashion.  A lateral incision was made for a  posterior approach to the hip.  The iliotibial band and gluteal fascia  were incised posteriorly.  Short external rotators were identified and  taken down separate from the posterior capsule.  The posterior capsule  was then preserved for later repair but also  to protect the sciatic  nerve from any retractors.   At this point the fracture site was identified and using a corkscrew  instrument I was able to remove the femoral head.  I measured it using  the DePuy rings on the back table and found the 45 head matched best.  The acetabulum was debrided of any loose debris and then irrigated.  Attention was now directed to the femur and neck osteotomy was made into  the trochanteric fossa.  I used a starting drill to open up the femur,  hand reamed once then irrigated to prevent fat emboli.  I began  broaching with a zero broach setting my anteversion at 3 degrees and  then broached up to a size 3 which set nicely.  I used a calcar planer  just to finish off a better cut of the anterior aspect of the neck and  then did a trial reduction.  Trial reduction with a 4 standard stem with  adapter with 45 ball was then impacted.  The trial broach was carried  out with 4 standard neck  and -3 adapter.  Trial reduction was carried  out.  I was happy with the leg lengths, hip stability and tension on the  musculature.  Given this I dislocated the apparatus and removed the  trial component.  Following irrigation, a final 4 standard stem was then  impacted where the 11 mm neck cut had been made.  Thus the -3 adapter  was placed and impacted into the 45 ball and this construct was impacted  on to clean dry trunnion.  The hip was cleaned at this point and  reduced, irrigated throughout the case.   I reapproximated the posterior capsule to the superior leaflet using #1  Vicryl.  Medium Hemovac drain was placed.  The iliotibial band and  gluteal fascia were reapproximated using #1 Vicryl as well.  The  remainder of the wound was closed with 2-0 Vicryl and staples for the  skin.  The skin was cleaned, dried and dressed sterilely with a Mepilex  dressing.  She was brought to the recovery room in stable condition  tolerating the procedure well.      Madlyn Frankel  Charlann Boxer, M.D.  Electronically Signed     MDO/MEDQ  D:  10/27/2008  T:  10/27/2008  Job:  [id number]

## 2011-05-14 NOTE — Discharge Summary (Signed)
Cynthia Graham, Cynthia Graham                  ACCOUNT NO.:  [id number]   MEDICAL RECORD NO.:  [id number]          PATIENT TYPE:  INP   LOCATION:  1613                         FACILITY:  [facility]   PHYSICIAN:  Madlyn Frankel. Charlann Boxer, M.D.  DATE OF BIRTH:  [birth year]   DATE OF ADMISSION:  10/25/2008  DATE OF DISCHARGE:  10/30/2008                               DISCHARGE SUMMARY   ADMITTING DIAGNOSES:  1. Osteoarthritis.  2. Hypertension.  3. Hyperlipidemia.  4. Degenerative joint disease.   DISCHARGE DIAGNOSES:  1. Osteopenia with right femur fracture.  2. Osteoarthritis.  3. Hypertension.  4. Anxiety.  5. Hyperlipidemia.  6. Postoperative hypokalemia.   HISTORY OF PRESENT ILLNESS:  An 77 year old female who fell had  immediate right hip pain, inability to bear weight.  Was brought to the  emergency department via emergency medical service.  Evaluated in the  ER.  X-rays revealed a displaced femoral neck fracture.  Admitted to  orthopedic service.  Cleared medically before surgery.   CONSULTS:  Dr. Darryll Capers.   PROCEDURE:  A right hip hemiarthroplasty by surgeon Dr. Durene Romans.   LABORATORY DATA UPON ADMISSION:  CBC done.  Her final reading was white  blood cell 5.2, hemoglobin 10, hematocrit 30, platelets 145.  Metabolic  panel final reading:  Sodium 139, potassium 3.1, glucose 117, creatinine  0.6.  Her coagulation INR was 1.  Her PTT was slightly elevated at 49.  Kidney function:  Final GFR greater than 60.  Calcium 8.1.  BNP was 114.  TSH 1.991.  Urine culture showed multiple colonies with colony count of  25,000.   Cardiology EKG showed sinus rhythm with occasional PVCs.   RADIOLOGY:  Chest one-view from May 2009 that showed no acute  cardiopulmonary process.  Portable pelvis showed right hip  hemiarthroplasty without fracture or other complication.   HOSPITAL COURSE:  The patient admitted through the emergency department  to orthopedic service.  Cleared medically.  Surgery  performed on the  1st.  Her stay was unremarkable.  She stayed hemodynamically stable.  Throughout her course of stay she was afebrile.  Made moderate progress  with physical therapy.  She did have a little bit of low potassium.  This was replenished several times.  By the 4th she was orthopedically  stable.  Dressing was changed.  Wound had no active drainage.  She was  ready for discharge home with home health care physical therapy.   DISCHARGE DISPOSITION:  Discharged home in stable improved condition  with home health care physical therapy.   DISCHARGE DIET:  No restrictions.   DISCHARGE WOUND CARE:  Keep dry.   DISCHARGE MEDICATIONS:  1. Lovenox 40 mg subcu q.24 x11 days.  2. Aspirin 325 mg 1 p.o. daily x4 weeks.  3. Robaxin 500 mg 1 p.o. q.6.  4. Vicodin 5/325 one to two p.o. q.4-6.  5. Iron 325 mg p.o. t.i.d.  6. Colace 100 mg p.o. b.i.d.  7. MiraLax 17 g p.o. daily.  8. Klor-Con 40 mEq p.o. b.i.d. x3 weeks.  9. Metoprolol 50 mg p.o. b.i.d.  10.Lorazepam  0.5 mg b.i.d. p.r.n.  11.Prevacid 30 mg p.o. daily.  12.Lasix 40 mg p.o. daily.  13.Benazepril 40 mg p.o. daily.  14.Amlodipine 5 mg p.o. daily.  15.Spiriva 2 puffs q.6 p.r.n.  16.Promethazine 25 mg p.o. q.6 p.r.n. nausea.   DISCHARGE FOLLOWUP:  Follow up with Dr. Charlann Boxer, phone number [phone].   DISCHARGE PHYSICAL THERAPY:  Weightbearing as tolerated with the use of  a rolling walker.     ______________________________  Cynthia Graham      Madlyn Frankel. Charlann Boxer, M.D.  Electronically Signed    BLM/MEDQ  D:  12/16/2008  T:  12/16/2008  Job:  [id number]   cc:   Stacie Glaze, MD  [address] [place]  [place] [address]   Gordy Savers, MD  [address] [place]  [place] [address]

## 2011-07-08 ENCOUNTER — Other Ambulatory Visit: Payer: Self-pay | Admitting: Internal Medicine

## 2011-09-20 ENCOUNTER — Other Ambulatory Visit: Payer: Self-pay | Admitting: Internal Medicine

## 2011-09-28 LAB — CBC
HCT: 35.9 — ABNORMAL LOW
HCT: 29.3 — ABNORMAL LOW
HCT: 30 — ABNORMAL LOW
HCT: 36.1
Hemoglobin: 12.2
Hemoglobin: 10 — ABNORMAL LOW
Hemoglobin: 12
MCHC: 32.9
MCHC: 33.2
MCV: 93.2
MCV: 93.1
MCV: 93.1
MCV: 93.3
Platelets: 108 — ABNORMAL LOW
Platelets: 114 — ABNORMAL LOW
Platelets: 131 — ABNORMAL LOW
RBC: 3.88
RBC: 4.41
RBC: 2.82 — ABNORMAL LOW
RBC: 3.22 — ABNORMAL LOW
RDW: 14
RDW: 14.6
WBC: 6.6
WBC: 5.2
WBC: 5.4
WBC: 6.3

## 2011-09-28 LAB — BASIC METABOLIC PANEL
BUN: 4 — ABNORMAL LOW
BUN: 5 — ABNORMAL LOW
BUN: 7
CO2: 30
CO2: 33 — ABNORMAL HIGH
Calcium: 8.3 — ABNORMAL LOW
Chloride: 105
Chloride: 102
Chloride: 105
Chloride: 99
Creatinine, Ser: 0.66
Creatinine, Ser: 0.79
GFR calc Af Amer: >60
GFR calc Af Amer: >60
GFR calc Af Amer: >60
GFR calc non Af Amer: >60
GFR calc non Af Amer: >60
GFR calc non Af Amer: >60
Glucose, Bld: 100 — ABNORMAL HIGH
Glucose, Bld: 125 — ABNORMAL HIGH
Glucose, Bld: 94
Glucose, Bld: 96
Potassium: 3.5
Potassium: 3.1 — ABNORMAL LOW
Potassium: 3.1 — ABNORMAL LOW
Potassium: 3.3 — ABNORMAL LOW
Potassium: 3.7
Sodium: 141
Sodium: 141
Sodium: 141

## 2011-09-28 LAB — CROSSMATCH: Antibody Screen: NEGATIVE

## 2011-09-28 LAB — DIFFERENTIAL: Basophils Absolute: 0

## 2011-09-28 LAB — B-NATRIURETIC PEPTIDE (CONVERTED LAB): Pro B Natriuretic peptide (BNP): 114 — ABNORMAL HIGH

## 2011-09-28 LAB — APTT: aPTT: 49 — ABNORMAL HIGH

## 2011-09-28 LAB — URINE CULTURE

## 2011-09-28 LAB — TSH: TSH: 1.991

## 2011-10-28 ENCOUNTER — Ambulatory Visit: Payer: MEDICARE | Admitting: Internal Medicine

## 2011-11-01 ENCOUNTER — Other Ambulatory Visit: Payer: Self-pay

## 2011-11-01 NOTE — Telephone Encounter (Signed)
Called in to [facility] 

## 2011-11-04 ENCOUNTER — Encounter: Payer: Self-pay | Admitting: Internal Medicine

## 2011-11-04 ENCOUNTER — Ambulatory Visit (INDEPENDENT_AMBULATORY_CARE_PROVIDER_SITE_OTHER): Payer: Medicare Other | Admitting: Internal Medicine

## 2011-11-04 VITALS — BP 160/90 | Temp 98.0°F | Wt 114.0 lb

## 2011-11-04 DIAGNOSIS — I10 Essential (primary) hypertension: Secondary | ICD-10-CM

## 2011-11-04 DIAGNOSIS — F411 Generalized anxiety disorder: Secondary | ICD-10-CM

## 2011-11-04 DIAGNOSIS — Z23 Encounter for immunization: Secondary | ICD-10-CM

## 2011-11-04 DIAGNOSIS — I4891 Unspecified atrial fibrillation: Secondary | ICD-10-CM

## 2011-11-04 DIAGNOSIS — E785 Hyperlipidemia, unspecified: Secondary | ICD-10-CM

## 2011-11-04 DIAGNOSIS — Z0000 Encounter for general adult medical examination without abnormal findings: Secondary | ICD-10-CM

## 2011-11-04 MED ORDER — LORAZEPAM 0.5 MG PO TABS: 0.5000 mg | ORAL_TABLET | Freq: Every day | ORAL | Status: DC | PRN

## 2011-11-04 NOTE — Patient Instructions (Signed)
He is.lows    It is important that you exercise regularly, at least 20 minutes 3 to 4 times per week.  If you develop chest pain or shortness of breath seek  medical attention.  Return in 6 months for follow-up

## 2011-11-04 NOTE — Progress Notes (Signed)
  Subjective:    Patient ID: Cynthia Graham, female    DOB: 12/23/1917, 77 y.o.   MRN: [id number]  HPI  77 year old patient who is seen today for followup. She has a history of paroxysmal nature fibrillation. She has hypertension which has been controlled on metoprolol. She also is on ACE inhibition. No concerns or complaints today. She does have a history of chronic anxiety and is requesting a lorazepam refill. She has mild dyslipidemia. Denies any cardiopulmonary complaints. She is seen today for her six-month followup accompanied by her daughter and doing quite well   Review of Systems  Constitutional: Negative.   HENT: Negative for hearing loss, congestion, sore throat, rhinorrhea, dental problem, sinus pressure and tinnitus.   Eyes: Negative for pain, discharge and visual disturbance.  Respiratory: Negative for cough and shortness of breath.   Cardiovascular: Negative for chest pain, palpitations and leg swelling.  Gastrointestinal: Negative for nausea, vomiting, abdominal pain, diarrhea, constipation, blood in stool and abdominal distention.  Genitourinary: Negative for dysuria, urgency, frequency, hematuria, flank pain, vaginal bleeding, vaginal discharge, difficulty urinating, vaginal pain and pelvic pain.  Musculoskeletal: Negative for joint swelling, arthralgias and gait problem.  Skin: Negative for rash.  Neurological: Negative for dizziness, syncope, speech difficulty, weakness, numbness and headaches.  Hematological: Negative for adenopathy.  Psychiatric/Behavioral: Negative for behavioral problems, dysphoric mood and agitation. The patient is not nervous/anxious.        Objective:   Physical Exam  Constitutional: She is oriented to person, place, and time. She appears well-developed and well-nourished.  HENT:  Head: Normocephalic.  Right Ear: External ear normal.  Left Ear: External ear normal.  Mouth/Throat: Oropharynx is clear and moist.  Eyes: Conjunctivae and EOM are  normal. Pupils are equal, round, and reactive to light.  Neck: Normal range of motion. Neck supple. No thyromegaly present.  Cardiovascular: Normal rate and normal heart sounds.        Frequent ectopics. Normal rate  Pulmonary/Chest: Effort normal and breath sounds normal.  Abdominal: Soft. Bowel sounds are normal. She exhibits no mass. There is no tenderness.  Musculoskeletal: Normal range of motion. She exhibits no edema.  Lymphadenopathy:    She has no cervical adenopathy.  Neurological: She is alert and oriented to person, place, and time.  Skin: Skin is warm and dry. No rash noted.  Psychiatric: She has a normal mood and affect. Her behavior is normal.          Assessment & Plan:    Hypertension well controlled. Repeat blood pressure 130/80 Paroxysmal atrial fibrillation. Rate well controlled today with frequent ectopics probable normal sinus rhythm with frequent PACs Anxiety disorder. Lorazepam refill  We'll recheck in 6 months for exam and laboratory update. Medications are refilled.

## 2011-12-22 ENCOUNTER — Emergency Department (HOSPITAL_COMMUNITY): Payer: Medicare Other

## 2011-12-22 ENCOUNTER — Ambulatory Visit (INDEPENDENT_AMBULATORY_CARE_PROVIDER_SITE_OTHER): Payer: Medicare Other | Admitting: Family Medicine

## 2011-12-22 ENCOUNTER — Telehealth: Payer: Self-pay

## 2011-12-22 ENCOUNTER — Encounter: Payer: Self-pay | Admitting: Family Medicine

## 2011-12-22 ENCOUNTER — Encounter (HOSPITAL_COMMUNITY): Payer: Self-pay | Admitting: *Deleted

## 2011-12-22 ENCOUNTER — Emergency Department (HOSPITAL_COMMUNITY)
Admission: EM | Admit: 2011-12-22 | Discharge: 2011-12-22 | Disposition: A | Discharge status: Home / Self Care | Payer: Medicare Other | Attending: Emergency Medicine | Admitting: Emergency Medicine

## 2011-12-22 ENCOUNTER — Other Ambulatory Visit: Payer: Self-pay

## 2011-12-22 VITALS — BP 180/120 | Temp 98.5°F | Wt 116.0 lb

## 2011-12-22 DIAGNOSIS — I10 Essential (primary) hypertension: Secondary | ICD-10-CM

## 2011-12-22 DIAGNOSIS — R209 Unspecified disturbances of skin sensation: Secondary | ICD-10-CM | POA: Insufficient documentation

## 2011-12-22 DIAGNOSIS — M1990 Unspecified osteoarthritis, unspecified site: Secondary | ICD-10-CM | POA: Insufficient documentation

## 2011-12-22 DIAGNOSIS — F411 Generalized anxiety disorder: Secondary | ICD-10-CM | POA: Insufficient documentation

## 2011-12-22 DIAGNOSIS — K089 Disorder of teeth and supporting structures, unspecified: Secondary | ICD-10-CM | POA: Insufficient documentation

## 2011-12-22 DIAGNOSIS — R011 Cardiac murmur, unspecified: Secondary | ICD-10-CM | POA: Insufficient documentation

## 2011-12-22 DIAGNOSIS — K219 Gastro-esophageal reflux disease without esophagitis: Secondary | ICD-10-CM | POA: Insufficient documentation

## 2011-12-22 DIAGNOSIS — E785 Hyperlipidemia, unspecified: Secondary | ICD-10-CM | POA: Insufficient documentation

## 2011-12-22 DIAGNOSIS — N390 Urinary tract infection, site not specified: Secondary | ICD-10-CM | POA: Insufficient documentation

## 2011-12-22 DIAGNOSIS — R000 Tachycardia, unspecified: Secondary | ICD-10-CM | POA: Insufficient documentation

## 2011-12-22 DIAGNOSIS — Z79899 Other long term (current) drug therapy: Secondary | ICD-10-CM | POA: Insufficient documentation

## 2011-12-22 LAB — BASIC METABOLIC PANEL
BUN: 15 mg/dL (ref 6–23)
CO2: 25 mEq/L (ref 19–32)
Calcium: 9.8 mg/dL (ref 8.4–10.5)
Creatinine, Ser: 0.55 mg/dL (ref 0.50–1.10)
Glucose, Bld: 98 mg/dL (ref 70–99)

## 2011-12-22 LAB — URINALYSIS, ROUTINE W REFLEX MICROSCOPIC
Bilirubin Urine: NEGATIVE
Glucose, UA: NEGATIVE mg/dL
Hgb urine dipstick: NEGATIVE
Protein, ur: NEGATIVE mg/dL

## 2011-12-22 LAB — URINE MICROSCOPIC-ADD ON

## 2011-12-22 MED ORDER — BENAZEPRIL HCL 40 MG PO TABS
40.0000 mg | ORAL_TABLET | Freq: Every day | ORAL | Status: DC
  Administered 2011-12-22: 40 mg via ORAL
  Filled 2011-12-22 (×2): qty 1

## 2011-12-22 MED ORDER — AMLODIPINE BESYLATE 5 MG PO TABS
5.0000 mg | ORAL_TABLET | ORAL | Status: AC
  Administered 2011-12-22: 5 mg via ORAL
  Filled 2011-12-22: qty 1

## 2011-12-22 MED ORDER — CEPHALEXIN 500 MG PO CAPS: 500.0000 mg | ORAL_CAPSULE | Freq: Two times a day (BID) | ORAL | Status: AC

## 2011-12-22 MED ORDER — METOPROLOL TARTRATE 25 MG PO TABS
100.0000 mg | ORAL_TABLET | Freq: Once | ORAL | Status: AC
  Administered 2011-12-22: 100 mg via ORAL
  Filled 2011-12-22: qty 4

## 2011-12-22 MED ORDER — FUROSEMIDE 40 MG PO TABS
40.0000 mg | ORAL_TABLET | ORAL | Status: AC
  Administered 2011-12-22: 40 mg via ORAL
  Filled 2011-12-22: qty 1

## 2011-12-22 NOTE — ED Provider Notes (Signed)
History     CSN: [id number]  Arrival date & time 12/22/11  1757   First MD Initiated Contact with Patient 12/22/11 1934      Chief Complaint  Patient presents with  . Hypertension    (Consider location/radiation/quality/duration/timing/severity/associated sxs/prior treatment) Patient is a 77 y.o. female presenting with hypertension.  Hypertension   The patient presents with one day of left sided dysesthesia. She does not recall notable precipitants, notes that for the past 24 hours she's had persistent mild dysesthesia about the left face and left arm. There is no pain, and no clear provoking or alleviating factors. The patient denies any facial droop, any slurred speech, any visual changes, any ataxia, any unilateral weakness, or any other focal complaints. She notes that she has been compliant with her medications until today when she has not taken any antihypertensives Past Medical History  Diagnosis Date  . ANXIETY 05/17/2008  . ATRIAL FIBRILLATION WITH RAPID VENTRICULAR RESPONSE 10/09/2009  . CALLUS, RIGHT FOOT 02/11/2009  . EPISTAXIS, RECURRENT 01/15/2010  . GAIT IMBALANCE 08/16/2008  . GERD 08/08/2007  . HYPERLIPIDEMIA 05/17/2008  . HYPERTENSION 08/08/2007  . OSTEOARTHRITIS 05/17/2008  . SOB 08/18/2007  . WEAKNESS 05/17/2008  . DJD (degenerative joint disease)   . History of hiatal hernia     Past Surgical History  Procedure Date  . Abdominal hysterectomy   . Esophagogastroduodenoscopy     stricture, hiatal hernia ,reflux esopg.    Family History  Problem Relation Age of Onset  . Hypertension Neg Hx     family    History  Substance Use Topics  . Smoking status: Former Smoker    Quit date: 12/27/1968  . Smokeless tobacco: Never Used  . Alcohol Use: No    OB History    Grav Para Term Preterm Abortions TAB SAB Ect Mult Living                  Review of Systems  Constitutional:       HPI  HENT:       HPI otherwise negative  Eyes: Negative.     Respiratory:       HPI, otherwise negative  Cardiovascular:       HPI, otherwise nmegative  Gastrointestinal: Negative for vomiting.  Genitourinary:       HPI, otherwise negative  Musculoskeletal:       HPI, otherwise negative  Skin: Negative.   Neurological: Negative for syncope.    Allergies  Review of patient's allergies indicates no known allergies.  Home Medications   Current Outpatient Rx  Name Route Sig Dispense Refill  . AMLODIPINE BESYLATE 5 MG PO TABS Oral Take 1 tablet (5 mg total) by mouth daily. 90 tablet 6  . BENAZEPRIL HCL 40 MG PO TABS Oral Take 1 tablet (40 mg total) by mouth daily. 90 tablet 6  . FUROSEMIDE 40 MG PO TABS Oral Take 1 tablet (40 mg total) by mouth daily. 90 tablet 6  . LANSOPRAZOLE 30 MG PO CPDR Oral Take 1 capsule (30 mg total) by mouth daily as needed. 90 capsule 6  . LORAZEPAM 0.5 MG PO TABS Oral Take 1 tablet (0.5 mg total) by mouth daily as needed. 30 tablet 3  . METOPROLOL TARTRATE 100 MG PO TABS Oral Take 1 tablet (100 mg total) by mouth 2 (two) times daily. 90 tablet 6  . PROAIR HFA 108 (90 BASE) MCG/ACT IN AERS  USE 2 PUFFS BY MOUTH EVERY 6 HOURS AS NEEDED 18 g 1  BP 202/99  Pulse 92  Temp 97.9 F (36.6 C)  Resp 20  Ht 5\' 2"  (1.575 m)  Wt 116 lb (52.617 kg)  BMI 21.22 kg/m2  SpO2 98%  Physical Exam  Nursing note and vitals reviewed. Constitutional: She is oriented to person, place, and time. She appears well-developed and well-nourished. No distress.  HENT:  Head: Normocephalic and atraumatic.  Mouth/Throat: Abnormal dentition. No dental caries.  Eyes: Conjunctivae and EOM are normal.  Cardiovascular: Regular rhythm.  Tachycardia present.   Murmur heard. Pulmonary/Chest: Effort normal and breath sounds normal. No stridor. No respiratory distress.  Abdominal: She exhibits no distension.  Musculoskeletal: She exhibits no edema.  Neurological: She is alert and oriented to person, place, and time. She displays no atrophy and  no tremor. No cranial nerve deficit or sensory deficit. She exhibits normal muscle tone. She displays a negative Romberg sign. She displays no seizure activity.  Skin: Skin is warm and dry.  Psychiatric: She has a normal mood and affect.    ED Course  Procedures (including critical care time)   Labs Reviewed  BASIC METABOLIC PANEL  URINALYSIS, ROUTINE W REFLEX MICROSCOPIC   No results found.   No diagnosis found.    MDM  This elderly female presents with a day of daily described dysesthesia, no other complaints. On exam the patient is in no distress, is laughing and interacting appropriately, and has no focal neurologic deficits. The patient's blood pressure was initially elevated, but it responded appropriately to dosing of her home medications. With this improvement in her blood pressure, the disappearance of her complaints she was stable for discharge. The patient's labs were notable for urinary tract infection, for which she was provided antibiotics.        Gerhard Munch, MD 12/23/11 0010

## 2011-12-22 NOTE — Progress Notes (Signed)
  Subjective:    Patient ID: Cynthia Graham, female    DOB: 10-Feb-1917, 77 y.o.   MRN: [id number]  HPI Cynthia Graham is a 77 year old female brought in by her daughter for evaluation of numbness in her left face and left arm for one day.  She has a history of underlying hypertension and does not take her medication on a regular basis.  About 8 p.m. Last night.  She noticed numbness left side of her face and left arm.  No difficulty with speech.  No change in her gait.  BP today 180/120 right arm sitting position, and she was taken to the emergency room on my recommendation   Review of Systems General and neurologic review of systems otherwise negative except for history of gait abnormality secondary to age.  No previous history of stroke    Objective:   Physical Exam Thin elderly female, in no acute distress.  BP right arm, 180/120, pulse 80 and regular       Assessment & Plan:  Marked hypertension with numbness in her left arm and face.  Plan sent directly to the emergency room for evaluation

## 2011-12-22 NOTE — Patient Instructions (Signed)
Go to directly to the emergency room for evaluation

## 2011-12-22 NOTE — ED Notes (Signed)
Pt states she went to see her pcp due to left side facial swelling and left hand swelling. Pt denies chest pain or sob.

## 2011-12-22 NOTE — ED Notes (Signed)
Pt's daughter states pt was at her pcp and bp was 180/120

## 2011-12-22 NOTE — ED Notes (Signed)
Patient returned from CT

## 2011-12-22 NOTE — Telephone Encounter (Signed)
Pt states the left side of her face and arm "feels funny". Pt denies dropping of face and pain on left side.    Per Dr. Amador Cunas pt needs to be seen today.  Pt aware of appt 12/22/11 with Dr. Tawanna Cooler at 4:30.

## 2012-01-10 ENCOUNTER — Encounter: Payer: Self-pay | Admitting: Internal Medicine

## 2012-01-10 ENCOUNTER — Ambulatory Visit (INDEPENDENT_AMBULATORY_CARE_PROVIDER_SITE_OTHER): Payer: Medicare Other | Admitting: Internal Medicine

## 2012-01-10 DIAGNOSIS — N3000 Acute cystitis without hematuria: Secondary | ICD-10-CM

## 2012-01-10 DIAGNOSIS — I10 Essential (primary) hypertension: Secondary | ICD-10-CM

## 2012-01-10 DIAGNOSIS — I6350 Cerebral infarction due to unspecified occlusion or stenosis of unspecified cerebral artery: Secondary | ICD-10-CM

## 2012-01-10 DIAGNOSIS — I6381 Other cerebral infarction due to occlusion or stenosis of small artery: Secondary | ICD-10-CM

## 2012-01-10 DIAGNOSIS — I4891 Unspecified atrial fibrillation: Secondary | ICD-10-CM

## 2012-01-10 NOTE — Patient Instructions (Signed)
Limit your sodium (Salt) intake  Please check your blood pressure on a regular basis.  If it is consistently greater than 150/90, please make an office appointment.  Call for any new symptoms such as worsening numbness or any weakness

## 2012-01-10 NOTE — Progress Notes (Signed)
  Subjective:    Patient ID: Cynthia Graham, female    DOB: May 29, 1917, 78 y.o.   MRN: [id number]  HPI  78 year old patient who had the sudden onset of left facial and left arm numbness without motor deficit on Christmas Day. Symptoms have persisted unchanged. She was evaluated in the office the following day and noted to have accelerated hypertension and was sent to the ED for evaluation. A head CT was performed it revealed microvascular changes only. She was treated for a UTI. She is seen today in followup unchanged. She does have a history of atrial fibrillation but does not take aspirin on a regular basis. She has not felt to be a candidate for Coumadin anticoagulation.    Review of Systems  Constitutional: Negative.   HENT: Negative for hearing loss, congestion, sore throat, rhinorrhea, dental problem, sinus pressure and tinnitus.   Eyes: Negative for pain, discharge and visual disturbance.  Respiratory: Negative for cough and shortness of breath.   Cardiovascular: Negative for chest pain, palpitations and leg swelling.  Gastrointestinal: Negative for nausea, vomiting, abdominal pain, diarrhea, constipation, blood in stool and abdominal distention.  Genitourinary: Negative for dysuria, urgency, frequency, hematuria, flank pain, vaginal bleeding, vaginal discharge, difficulty urinating, vaginal pain and pelvic pain.  Musculoskeletal: Negative for joint swelling, arthralgias and gait problem.  Skin: Negative for rash.  Neurological: Positive for numbness. Negative for dizziness, syncope, speech difficulty, weakness and headaches.  Hematological: Negative for adenopathy.  Psychiatric/Behavioral: Negative for behavioral problems, dysphoric mood and agitation. The patient is not nervous/anxious.        Objective:   Physical Exam  Constitutional: She appears well-developed and well-nourished. No distress.  Neck: Normal range of motion. Neck supple.       No carotid bruits  Cardiovascular:  Normal rate and normal heart sounds.        Rhythm slightly irregular with a controlled ventricular response  Neurological:       Left facial numbness No drift of the outstretched arms DDS- does not extinguish on the left          Assessment & Plan:   Left facial and left arm numbness. Suspect patient has had a right brain lacunar pure sensory stroke. She does have a history of atrial fibrillation and cannot rule out a cardioembolic stroke. Patient has not been taking aspirin on a regular basis. The risks and benefits of aspirin therapy versus Coumadin anticoagulation discussed. The patient does not wish to consider Coumadin anticoagulation at this time. The patient will start 81 mg aspirin daily and will recheck in the spring. She will report any new symptoms. Hypertension controlled will continue multivitamin regimen Atrial fibrillation, history of

## 2012-04-26 ENCOUNTER — Ambulatory Visit: Payer: Medicare Other | Admitting: Internal Medicine

## 2012-05-02 ENCOUNTER — Other Ambulatory Visit: Payer: Self-pay | Admitting: Internal Medicine

## 2012-05-04 ENCOUNTER — Encounter: Payer: Self-pay | Admitting: Internal Medicine

## 2012-05-04 ENCOUNTER — Ambulatory Visit (INDEPENDENT_AMBULATORY_CARE_PROVIDER_SITE_OTHER): Payer: Medicare Other | Admitting: Internal Medicine

## 2012-05-04 VITALS — BP 140/90 | HR 60 | Temp 97.4°F | Resp 18 | Wt 104.0 lb

## 2012-05-04 DIAGNOSIS — I4891 Unspecified atrial fibrillation: Secondary | ICD-10-CM

## 2012-05-04 DIAGNOSIS — M1990 Unspecified osteoarthritis, unspecified site: Secondary | ICD-10-CM

## 2012-05-04 DIAGNOSIS — I10 Essential (primary) hypertension: Secondary | ICD-10-CM

## 2012-05-04 DIAGNOSIS — Z0000 Encounter for general adult medical examination without abnormal findings: Secondary | ICD-10-CM

## 2012-05-04 DIAGNOSIS — E785 Hyperlipidemia, unspecified: Secondary | ICD-10-CM

## 2012-05-04 DIAGNOSIS — R110 Nausea: Secondary | ICD-10-CM

## 2012-05-04 DIAGNOSIS — R269 Unspecified abnormalities of gait and mobility: Secondary | ICD-10-CM

## 2012-05-04 LAB — CBC WITH DIFFERENTIAL/PLATELET
Basophils Absolute: 0 10*3/uL (ref 0.0–0.1)
Eosinophils Absolute: 0.1 10*3/uL (ref 0.0–0.7)
HCT: 39.5 % (ref 36.0–46.0)
Hemoglobin: 12.9 g/dL (ref 12.0–15.0)
Lymphs Abs: 1.2 10*3/uL (ref 0.7–4.0)
MCHC: 32.6 g/dL (ref 30.0–36.0)
MCV: 92.5 fl (ref 78.0–100.0)
Monocytes Absolute: 0.3 10*3/uL (ref 0.1–1.0)
Monocytes Relative: 7.1 % (ref 3.0–12.0)
Neutro Abs: 3.2 10*3/uL (ref 1.4–7.7)
RDW: 14.9 % — ABNORMAL HIGH (ref 11.5–14.6)

## 2012-05-04 LAB — TSH: TSH: 1.89 u[IU]/mL (ref 0.35–5.50)

## 2012-05-04 LAB — COMPREHENSIVE METABOLIC PANEL
ALT: 10 U/L (ref 0–35)
AST: 19 U/L (ref 0–37)
Alkaline Phosphatase: 78 U/L (ref 39–117)
Creatinine, Ser: 0.7 mg/dL (ref 0.4–1.2)
Sodium: 144 mEq/L (ref 135–145)
Total Bilirubin: 0.6 mg/dL (ref 0.3–1.2)

## 2012-05-04 MED ORDER — AMLODIPINE BESYLATE 5 MG PO TABS: 5.0000 mg | ORAL_TABLET | Freq: Every day | ORAL | Status: DC

## 2012-05-04 MED ORDER — PROMETHAZINE HCL 12.5 MG PO TABS: 12.5000 mg | ORAL_TABLET | Freq: Three times a day (TID) | ORAL | Status: DC | PRN

## 2012-05-04 MED ORDER — ALBUTEROL SULFATE HFA 108 (90 BASE) MCG/ACT IN AERS: 2.0000 | INHALATION_SPRAY | Freq: Four times a day (QID) | RESPIRATORY_TRACT | Status: DC | PRN

## 2012-05-04 MED ORDER — METOPROLOL TARTRATE 100 MG PO TABS: 100.0000 mg | ORAL_TABLET | Freq: Two times a day (BID) | ORAL | Status: DC

## 2012-05-04 MED ORDER — FUROSEMIDE 40 MG PO TABS: 40.0000 mg | ORAL_TABLET | Freq: Every day | ORAL | Status: DC

## 2012-05-04 MED ORDER — BENAZEPRIL HCL 40 MG PO TABS: 40.0000 mg | ORAL_TABLET | Freq: Every day | ORAL | Status: DC

## 2012-05-04 MED ORDER — LANSOPRAZOLE 30 MG PO CPDR: 30.0000 mg | DELAYED_RELEASE_CAPSULE | Freq: Every day | ORAL | Status: DC | PRN

## 2012-05-04 MED ORDER — LORAZEPAM 0.5 MG PO TABS: 0.5000 mg | ORAL_TABLET | Freq: Every day | ORAL | Status: DC | PRN

## 2012-05-04 NOTE — Progress Notes (Signed)
Subjective:    Patient ID: Cynthia Graham, female    DOB: September 23, 1917, 78 y.o.   MRN: [id number]  HPI  78 year old patient who is seen today for a preventive health examination. She has a history of paroxysmal atrial fibrillation in the past. She has treated hypertension history of mild dyslipidemia and gastroesophageal reflux disease. She has a history of mild gait instability. She is doing quite well. Her only complaint is some occasional nausea. Medical regimen does include a PPI. Has some exertional shortness of breath periodically but no exertional chest pain and general she has done quite well at 89  Past Medical History  Diagnosis Date  . ANXIETY 05/17/2008  . ATRIAL FIBRILLATION WITH RAPID VENTRICULAR RESPONSE 10/09/2009  . CALLUS, RIGHT FOOT 02/11/2009  . EPISTAXIS, RECURRENT 01/15/2010  . GAIT IMBALANCE 08/16/2008  . GERD 08/08/2007  . HYPERLIPIDEMIA 05/17/2008  . HYPERTENSION 08/08/2007  . OSTEOARTHRITIS 05/17/2008  . SOB 08/18/2007  . WEAKNESS 05/17/2008  . DJD (degenerative joint disease)   . History of hiatal hernia     History   Social History  . Marital Status: Married    Spouse Name: N/A    Number of Children: N/A  . Years of Education: N/A   Occupational History  . Not on file.   Social History Main Topics  . Smoking status: Former Smoker    Quit date: 12/27/1968  . Smokeless tobacco: Never Used  . Alcohol Use: No  . Drug Use: No  . Sexually Active: Not on file   Other Topics Concern  . Not on file   Social History Narrative  . No narrative on file    Past Surgical History  Procedure Date  . Abdominal hysterectomy   . Esophagogastroduodenoscopy     stricture, hiatal hernia ,reflux esopg.    Family History  Problem Relation Age of Onset  . Hypertension Neg Hx     family    No Known Allergies  Current Outpatient Prescriptions on File Prior to Visit  Medication Sig Dispense Refill  . amLODipine (NORVASC) 5 MG tablet Take 1 tablet (5 mg total) by  mouth daily.  90 tablet  6  . benazepril (LOTENSIN) 40 MG tablet Take 1 tablet (40 mg total) by mouth daily.  90 tablet  6  . furosemide (LASIX) 40 MG tablet TAKE 1 TABLET BY MOUTH EVERY DAY  90 tablet  1  . lansoprazole (PREVACID) 30 MG capsule Take 1 capsule (30 mg total) by mouth daily as needed.  90 capsule  6  . LORazepam (ATIVAN) 0.5 MG tablet Take 1 tablet (0.5 mg total) by mouth daily as needed.  30 tablet  3  . metoprolol (LOPRESSOR) 100 MG tablet Take 1 tablet (100 mg total) by mouth 2 (two) times daily.  90 tablet  6  . PROAIR HFA 108 (90 BASE) MCG/ACT inhaler USE 2 PUFFS BY MOUTH EVERY 6 HOURS AS NEEDED  18 g  1    BP 140/90  Pulse 60  Temp(Src) 97.4 F (36.3 C) (Oral)  Resp 18  Wt 104 lb (47.174 kg)   1. Risk factors, based on past  M,S,F history-  cardiovascular risk factors include age hypertension and dyslipidemia. She does have a history of paroxysmal atrial fibrillation  2.  Physical activities: Fairly sedentary due to to arthritis and gait instability  3.  Depression/mood: No history of depression or mood disorder  4.  Hearing: Only minor deficits  5.  ADL's:  requires mild assistance in all  aspects of daily living  6.  Fall risk: Moderate due to to unsteady gait and arthritis  7.  Home safety: No problems identified  8.  Height weight, and visual acuity; height and weight stable no change in visual acuity  9.  Counseling: Heart healthy diet more regular exercise and activities encouraged 10. Lab orders based on risk factors: Laboratory profile reviewed  11. Referral : Not appropriate at this time  12. Care plan: Heart healthy low-salt diet encouraged  13. Cognitive assessment: Alert and appropriate with normal affect no significant cognitive dysfunction       Review of Systems  Constitutional: Negative for fever, appetite change, fatigue and unexpected weight change.  HENT: Negative for hearing loss, ear pain, nosebleeds, congestion, sore throat,  mouth sores, trouble swallowing, neck stiffness, dental problem, voice change, sinus pressure and tinnitus.   Eyes: Negative for photophobia, pain, redness and visual disturbance.  Respiratory: Positive for shortness of breath. Negative for cough and chest tightness.   Cardiovascular: Negative for chest pain, palpitations and leg swelling.  Gastrointestinal: Negative for nausea, vomiting, abdominal pain, diarrhea, constipation, blood in stool, abdominal distention and rectal pain.  Genitourinary: Negative for dysuria, urgency, frequency, hematuria, flank pain, vaginal bleeding, vaginal discharge, difficulty urinating, genital sores, vaginal pain, menstrual problem and pelvic pain.  Musculoskeletal: Positive for back pain, arthralgias and gait problem.  Skin: Negative for rash.  Neurological: Negative for dizziness, syncope, speech difficulty, weakness, light-headedness, numbness and headaches.  Hematological: Negative for adenopathy. Does not bruise/bleed easily.  Psychiatric/Behavioral: Negative for suicidal ideas, behavioral problems, self-injury, dysphoric mood and agitation. The patient is not nervous/anxious.        Objective:   Physical Exam  Constitutional: She is oriented to person, place, and time. She appears well-developed and well-nourished.       Blood pressure 140/70  HENT:  Head: Normocephalic and atraumatic.  Right Ear: External ear normal.  Left Ear: External ear normal.  Mouth/Throat: Oropharynx is clear and moist.  Eyes: Conjunctivae and EOM are normal.       Arcus senilis  Neck: Normal range of motion. Neck supple. No JVD present. No thyromegaly present.  Cardiovascular: Normal rate and normal heart sounds.   No murmur heard.      Occasional ectopics  Pedal pulses not easily palpable  Pulmonary/Chest: Effort normal and breath sounds normal. She has no wheezes. She has no rales.  Abdominal: Soft. Bowel sounds are normal. She exhibits no distension and no mass. There  is no tenderness. There is no rebound and no guarding.  Genitourinary: Vagina normal.  Musculoskeletal: Normal range of motion. She exhibits no edema and no tenderness.  Neurological: She is alert and oriented to person, place, and time. She has normal reflexes. No cranial nerve deficit. She exhibits normal muscle tone. Coordination normal.  Skin: Skin is warm and dry. No rash noted.  Psychiatric: She has a normal mood and affect. Her behavior is normal.          Assessment & Plan:   Preventive health examination Hypertension stable History of paroxysmal atrial fibrillation Osteoarthritis  Laboratory update will be reviewed No change medication Recheck 6 months

## 2012-05-04 NOTE — Patient Instructions (Signed)
Limit your sodium (Salt) intake  Take 81 mg of aspirin daily    It is important that you exercise regularly, at least 20 minutes 3 to 4 times per week.   Return in 6 months for follow-up

## 2012-06-01 ENCOUNTER — Other Ambulatory Visit: Payer: Self-pay | Admitting: Internal Medicine

## 2012-06-16 ENCOUNTER — Other Ambulatory Visit: Payer: Self-pay | Admitting: Internal Medicine

## 2012-06-23 ENCOUNTER — Other Ambulatory Visit: Payer: Self-pay | Admitting: Internal Medicine

## 2012-09-14 ENCOUNTER — Telehealth: Payer: Self-pay | Admitting: Internal Medicine

## 2012-09-14 ENCOUNTER — Other Ambulatory Visit: Payer: Self-pay | Admitting: Internal Medicine

## 2012-09-14 NOTE — Telephone Encounter (Signed)
This was done via escribe just alittle bit ago.

## 2012-09-14 NOTE — Telephone Encounter (Signed)
Pt needs script re-written for metoprolol (LOPRESSOR) 100 MG tablet take 1 pill twice a day for #90. [facility] on [address]. Pt only has 1 pill left.

## 2012-10-17 ENCOUNTER — Encounter (HOSPITAL_COMMUNITY): Payer: Self-pay | Admitting: *Deleted

## 2012-10-17 ENCOUNTER — Encounter: Payer: Self-pay | Admitting: Internal Medicine

## 2012-10-17 ENCOUNTER — Inpatient Hospital Stay (HOSPITAL_COMMUNITY)
Admission: EM | Admit: 2012-10-17 | Discharge: 2012-10-19 | DRG: 194 | Disposition: A | Discharge status: Home / Self Care | Payer: Medicare Other | Attending: Internal Medicine | Admitting: Internal Medicine

## 2012-10-17 ENCOUNTER — Emergency Department (HOSPITAL_COMMUNITY): Payer: Medicare Other

## 2012-10-17 ENCOUNTER — Ambulatory Visit (INDEPENDENT_AMBULATORY_CARE_PROVIDER_SITE_OTHER): Payer: Medicare Other | Admitting: Internal Medicine

## 2012-10-17 VITALS — BP 140/70 | Temp 97.7°F | Wt 91.0 lb

## 2012-10-17 DIAGNOSIS — R8281 Pyuria: Secondary | ICD-10-CM

## 2012-10-17 DIAGNOSIS — K029 Dental caries, unspecified: Secondary | ICD-10-CM | POA: Diagnosis present

## 2012-10-17 DIAGNOSIS — J90 Pleural effusion, not elsewhere classified: Secondary | ICD-10-CM | POA: Diagnosis present

## 2012-10-17 DIAGNOSIS — Z66 Do not resuscitate: Secondary | ICD-10-CM | POA: Diagnosis present

## 2012-10-17 DIAGNOSIS — K089 Disorder of teeth and supporting structures, unspecified: Secondary | ICD-10-CM

## 2012-10-17 DIAGNOSIS — R5381 Other malaise: Secondary | ICD-10-CM

## 2012-10-17 DIAGNOSIS — R82998 Other abnormal findings in urine: Secondary | ICD-10-CM | POA: Diagnosis present

## 2012-10-17 DIAGNOSIS — R269 Unspecified abnormalities of gait and mobility: Secondary | ICD-10-CM

## 2012-10-17 DIAGNOSIS — E876 Hypokalemia: Secondary | ICD-10-CM

## 2012-10-17 DIAGNOSIS — R079 Chest pain, unspecified: Secondary | ICD-10-CM

## 2012-10-17 DIAGNOSIS — I4891 Unspecified atrial fibrillation: Secondary | ICD-10-CM

## 2012-10-17 DIAGNOSIS — R5383 Other fatigue: Secondary | ICD-10-CM

## 2012-10-17 DIAGNOSIS — I10 Essential (primary) hypertension: Secondary | ICD-10-CM

## 2012-10-17 DIAGNOSIS — R0602 Shortness of breath: Secondary | ICD-10-CM

## 2012-10-17 DIAGNOSIS — R634 Abnormal weight loss: Secondary | ICD-10-CM | POA: Diagnosis present

## 2012-10-17 DIAGNOSIS — E785 Hyperlipidemia, unspecified: Secondary | ICD-10-CM | POA: Diagnosis present

## 2012-10-17 DIAGNOSIS — N390 Urinary tract infection, site not specified: Secondary | ICD-10-CM

## 2012-10-17 DIAGNOSIS — Z79899 Other long term (current) drug therapy: Secondary | ICD-10-CM

## 2012-10-17 DIAGNOSIS — Z681 Body mass index (BMI) 19 or less, adult: Secondary | ICD-10-CM

## 2012-10-17 DIAGNOSIS — J189 Pneumonia, unspecified organism: Secondary | ICD-10-CM

## 2012-10-17 DIAGNOSIS — R040 Epistaxis: Secondary | ICD-10-CM

## 2012-10-17 DIAGNOSIS — M1990 Unspecified osteoarthritis, unspecified site: Secondary | ICD-10-CM | POA: Diagnosis present

## 2012-10-17 DIAGNOSIS — K219 Gastro-esophageal reflux disease without esophagitis: Secondary | ICD-10-CM | POA: Diagnosis present

## 2012-10-17 DIAGNOSIS — L84 Corns and callosities: Secondary | ICD-10-CM

## 2012-10-17 DIAGNOSIS — K006 Disturbances in tooth eruption: Secondary | ICD-10-CM | POA: Diagnosis present

## 2012-10-17 DIAGNOSIS — F411 Generalized anxiety disorder: Secondary | ICD-10-CM

## 2012-10-17 DIAGNOSIS — Z87891 Personal history of nicotine dependence: Secondary | ICD-10-CM

## 2012-10-17 DIAGNOSIS — E46 Unspecified protein-calorie malnutrition: Secondary | ICD-10-CM | POA: Diagnosis present

## 2012-10-17 LAB — LACTIC ACID, PLASMA: Lactic Acid, Venous: 1.9 mmol/L (ref 0.5–2.2)

## 2012-10-17 LAB — CBC WITH DIFFERENTIAL/PLATELET
Basophils Absolute: 0 10*3/uL (ref 0.0–0.1)
Basophils Relative: 0 % (ref 0–1)
Hemoglobin: 11.1 g/dL — ABNORMAL LOW (ref 12.0–15.0)
MCHC: 33.6 g/dL (ref 30.0–36.0)
Monocytes Relative: 10 % (ref 3–12)
Neutro Abs: 8.2 10*3/uL — ABNORMAL HIGH (ref 1.7–7.7)
Neutrophils Relative %: 85 % — ABNORMAL HIGH (ref 43–77)
RDW: 15.9 % — ABNORMAL HIGH (ref 11.5–15.5)

## 2012-10-17 LAB — BASIC METABOLIC PANEL
CO2: 27 mEq/L (ref 19–32)
Calcium: 8.7 mg/dL (ref 8.4–10.5)
Creatinine, Ser: 0.63 mg/dL (ref 0.50–1.10)
GFR calc Af Amer: 87 mL/min — ABNORMAL LOW (ref >90)
GFR calc non Af Amer: 75 mL/min — ABNORMAL LOW (ref >90)

## 2012-10-17 LAB — URINALYSIS, ROUTINE W REFLEX MICROSCOPIC
Hgb urine dipstick: NEGATIVE
Specific Gravity, Urine: 1.022 (ref 1.005–1.030)
Urobilinogen, UA: 2 mg/dL — ABNORMAL HIGH (ref 0.0–1.0)

## 2012-10-17 LAB — URINE MICROSCOPIC-ADD ON

## 2012-10-17 MED ORDER — SODIUM CHLORIDE 0.9 % IV SOLN
INTRAVENOUS | Status: DC
  Administered 2012-10-17: 14:00:00 via INTRAVENOUS

## 2012-10-17 MED ORDER — POTASSIUM CHLORIDE CRYS ER 20 MEQ PO TBCR
40.0000 meq | EXTENDED_RELEASE_TABLET | Freq: Two times a day (BID) | ORAL | Status: AC
  Administered 2012-10-17 (×2): 40 meq via ORAL
  Filled 2012-10-17 (×2): qty 2

## 2012-10-17 MED ORDER — SODIUM CHLORIDE 0.9 % IV SOLN: INTRAVENOUS | Status: AC

## 2012-10-17 MED ORDER — ALBUTEROL SULFATE HFA 108 (90 BASE) MCG/ACT IN AERS: 2.0000 | INHALATION_SPRAY | Freq: Four times a day (QID) | RESPIRATORY_TRACT | Status: DC | PRN

## 2012-10-17 MED ORDER — SODIUM CHLORIDE 0.9 % IJ SOLN: 3.0000 mL | INTRAMUSCULAR | Status: DC | PRN

## 2012-10-17 MED ORDER — AMLODIPINE BESYLATE 5 MG PO TABS
5.0000 mg | ORAL_TABLET | Freq: Every morning | ORAL | Status: DC
  Administered 2012-10-18 – 2012-10-19 (×2): 5 mg via ORAL
  Filled 2012-10-17 (×2): qty 1

## 2012-10-17 MED ORDER — LORAZEPAM 0.5 MG PO TABS
0.5000 mg | ORAL_TABLET | Freq: Every day | ORAL | Status: DC | PRN
  Administered 2012-10-17: 0.5 mg via ORAL
  Filled 2012-10-17: qty 1

## 2012-10-17 MED ORDER — SODIUM CHLORIDE 0.9 % IV SOLN: 250.0000 mL | INTRAVENOUS | Status: DC | PRN

## 2012-10-17 MED ORDER — FUROSEMIDE 40 MG PO TABS
40.0000 mg | ORAL_TABLET | Freq: Every morning | ORAL | Status: DC
  Administered 2012-10-17 – 2012-10-19 (×3): 40 mg via ORAL
  Filled 2012-10-17 (×3): qty 1

## 2012-10-17 MED ORDER — METOPROLOL TARTRATE 100 MG PO TABS
100.0000 mg | ORAL_TABLET | Freq: Two times a day (BID) | ORAL | Status: DC
Start: 2012-10-17 — End: 2012-10-19
  Administered 2012-10-17 – 2012-10-19 (×5): 100 mg via ORAL
  Filled 2012-10-17 (×5): qty 1
  Filled 2012-10-17: qty 4

## 2012-10-17 MED ORDER — IOHEXOL 300 MG/ML  SOLN
80.0000 mL | Freq: Once | INTRAMUSCULAR | Status: AC | PRN
  Administered 2012-10-17: 80 mL via INTRAVENOUS

## 2012-10-17 MED ORDER — SODIUM CHLORIDE 0.9 % IJ SOLN
3.0000 mL | Freq: Two times a day (BID) | INTRAMUSCULAR | Status: DC
  Administered 2012-10-18 – 2012-10-19 (×2): 3 mL via INTRAVENOUS

## 2012-10-17 MED ORDER — LEVOFLOXACIN IN D5W 750 MG/150ML IV SOLN
750.0000 mg | Freq: Once | INTRAVENOUS | Status: AC
  Administered 2012-10-17: 750 mg via INTRAVENOUS
  Filled 2012-10-17: qty 150

## 2012-10-17 MED ORDER — PIPERACILLIN-TAZOBACTAM 3.375 G IVPB
3.3750 g | Freq: Three times a day (TID) | INTRAVENOUS | Status: DC
  Administered 2012-10-17 – 2012-10-18 (×3): 3.375 g via INTRAVENOUS
  Filled 2012-10-17 (×4): qty 50

## 2012-10-17 MED ORDER — ONDANSETRON HCL 4 MG/2ML IJ SOLN: 4.0000 mg | Freq: Three times a day (TID) | INTRAMUSCULAR | Status: AC | PRN

## 2012-10-17 MED ORDER — PANTOPRAZOLE SODIUM 40 MG PO TBEC
40.0000 mg | DELAYED_RELEASE_TABLET | Freq: Every day | ORAL | Status: DC
  Administered 2012-10-17 – 2012-10-19 (×3): 40 mg via ORAL
  Filled 2012-10-17 (×3): qty 1

## 2012-10-17 MED ORDER — BENAZEPRIL HCL 40 MG PO TABS
40.0000 mg | ORAL_TABLET | Freq: Every morning | ORAL | Status: DC
  Administered 2012-10-18 – 2012-10-19 (×2): 40 mg via ORAL
  Filled 2012-10-17 (×2): qty 1

## 2012-10-17 MED ORDER — DEXTROSE 5 % IV SOLN
500.0000 mg | INTRAVENOUS | Status: DC
  Administered 2012-10-18: 500 mg via INTRAVENOUS
  Filled 2012-10-17 (×3): qty 500

## 2012-10-17 NOTE — ED Notes (Signed)
Pt reports SOB x 2 weeks.  Pt denies any cp, or noticing swelling in her ankles at this time.  Pt reports SOB gets worse with exertion.

## 2012-10-17 NOTE — ED Notes (Signed)
Ambulated Pt. While on pulse ox. Pulse ox began at 92% while walking pulse ox desat to 90%

## 2012-10-17 NOTE — ED Notes (Signed)
Asked DP if blood cultures needed to be obtained prior to IV antibiotic. EDP stated that since pt was not going to the ICU, blood cultures were not necessary.

## 2012-10-17 NOTE — ED Notes (Signed)
Patient transported to CT 

## 2012-10-17 NOTE — ED Notes (Signed)
Pt report given to carol, RN on 4 [id number] [address]

## 2012-10-17 NOTE — Progress Notes (Signed)
Subjective:    Patient ID: Cynthia Graham, female    DOB: 15-Dec-1917, 78 y.o.   MRN: [id number]  HPI  78 year old patient who is seen today with a one-week history of the right sided chest pain. She has had some increasing shortness of breath productive cough. Right-sided chest pain is aggravated by deep breathing. She has a remote history of paroxysmal atrial fibrillation There's been some significant weight loss over the past 5 months. This has been associated with some worsening weakness. She describes a poor appetite  Wt Readings from Last 3 Encounters:  10/17/12 91 lb (41.277 kg)  05/04/12 104 lb (47.174 kg)  01/10/12 115 lb (52.164 kg)    Past Medical History  Diagnosis Date  . ANXIETY 05/17/2008  . ATRIAL FIBRILLATION WITH RAPID VENTRICULAR RESPONSE 10/09/2009  . CALLUS, RIGHT FOOT 02/11/2009  . EPISTAXIS, RECURRENT 01/15/2010  . GAIT IMBALANCE 08/16/2008  . GERD 08/08/2007  . HYPERLIPIDEMIA 05/17/2008  . HYPERTENSION 08/08/2007  . OSTEOARTHRITIS 05/17/2008  . SOB 08/18/2007  . WEAKNESS 05/17/2008  . DJD (degenerative joint disease)   . History of hiatal hernia     History   Social History  . Marital Status: Married    Spouse Name: N/A    Number of Children: N/A  . Years of Education: N/A   Occupational History  . Not on file.   Social History Main Topics  . Smoking status: Former Smoker    Quit date: 12/27/1968  . Smokeless tobacco: Never Used  . Alcohol Use: No  . Drug Use: No  . Sexually Active: Not on file   Other Topics Concern  . Not on file   Social History Narrative  . No narrative on file    Past Surgical History  Procedure Date  . Abdominal hysterectomy   . Esophagogastroduodenoscopy     stricture, hiatal hernia ,reflux esopg.  . Right hip fracture October 2009    Dr Charlann Boxer    Family History  Problem Relation Age of Onset  . Hypertension Neg Hx     family    No Known Allergies  Current Outpatient Prescriptions on File Prior to Visit    Medication Sig Dispense Refill  . albuterol (PROAIR HFA) 108 (90 BASE) MCG/ACT inhaler Inhale 2 puffs into the lungs every 6 (six) hours as needed for wheezing.  18 g  6  . amLODipine (NORVASC) 5 MG tablet Take 1 tablet (5 mg total) by mouth daily.  90 tablet  6  . benazepril (LOTENSIN) 40 MG tablet Take 1 tablet (40 mg total) by mouth daily.  90 tablet  6  . furosemide (LASIX) 40 MG tablet Take 1 tablet (40 mg total) by mouth daily.  90 tablet  6  . lansoprazole (PREVACID) 30 MG capsule Take 1 capsule (30 mg total) by mouth daily as needed.  90 capsule  6  . lansoprazole (PREVACID) 30 MG capsule TAKE ONE CAPSULE BY MOUTH EVERY DAY AS NEEDED  90 capsule  3  . LORazepam (ATIVAN) 0.5 MG tablet Take 1 tablet (0.5 mg total) by mouth daily as needed.  30 tablet  3  . metoprolol (LOPRESSOR) 100 MG tablet TAKE 1 TABLET BY MOUTH TWICE A DAY  180 tablet  3  . promethazine (PHENERGAN) 12.5 MG tablet Take 1 tablet (12.5 mg total) by mouth every 8 (eight) hours as needed for nausea.  20 tablet  0  . DISCONTD: benazepril (LOTENSIN) 40 MG tablet TAKE 1 TABLET (40 MG TOTAL) BY MOUTH DAILY.  90 tablet  3  . DISCONTD: metoprolol (LOPRESSOR) 100 MG tablet Take 1 tablet (100 mg total) by mouth 2 (two) times daily.  90 tablet  6  . DISCONTD: metoprolol (LOPRESSOR) 100 MG tablet TAKE 1 TABLET BY MOUTH TWICE A DAY  180 tablet  3    BP 140/70  Temp 97.7 F (36.5 C) (Oral)  Wt 91 lb (41.277 kg)     Review of Systems  Constitutional: Positive for activity change, appetite change and unexpected weight change. Negative for fever and fatigue.  HENT: Negative for hearing loss, ear pain, nosebleeds, congestion, sore throat, mouth sores, trouble swallowing, neck stiffness, dental problem, voice change, sinus pressure and tinnitus.   Eyes: Negative for photophobia, pain, redness and visual disturbance.  Respiratory: Positive for cough and shortness of breath. Negative for chest tightness.   Cardiovascular: Positive  for chest pain. Negative for palpitations and leg swelling.  Gastrointestinal: Negative for nausea, vomiting, abdominal pain, diarrhea, constipation, blood in stool, abdominal distention and rectal pain.  Genitourinary: Negative for dysuria, urgency, frequency, hematuria, flank pain, vaginal bleeding, vaginal discharge, difficulty urinating, genital sores, vaginal pain, menstrual problem and pelvic pain.  Musculoskeletal: Negative for back pain and arthralgias.  Skin: Negative for rash.  Neurological: Positive for weakness. Negative for dizziness, syncope, speech difficulty, light-headedness, numbness and headaches.  Hematological: Negative for adenopathy. Does not bruise/bleed easily.  Psychiatric/Behavioral: Negative for suicidal ideas, behavioral problems, self-injury, dysphoric mood and agitation. The patient is not nervous/anxious.        Objective:   Physical Exam  Constitutional: She is oriented to person, place, and time. She appears well-developed and well-nourished.  HENT:  Head: Normocephalic and atraumatic.  Right Ear: External ear normal.  Left Ear: External ear normal.  Mouth/Throat: Oropharynx is clear and moist.  Eyes: Conjunctivae normal and EOM are normal.  Neck: Normal range of motion. Neck supple. No JVD present. No thyromegaly present.       Possible right thyroid nodule  Cardiovascular: Normal heart sounds.   No murmur heard.      Irregular rhythm with a rate of 120  Pulmonary/Chest: Effort normal. She has no wheezes. She has rales.       Extensive rales and rhonchi involving the right lower hemithorax O2 saturation 92-95%  Abdominal: Soft. Bowel sounds are normal. She exhibits no distension and no mass. There is no tenderness. There is no rebound and no guarding.  Musculoskeletal: Normal range of motion. She exhibits no edema and no tenderness.  Neurological: She is alert and oriented to person, place, and time. She has normal reflexes. No cranial nerve deficit.  She exhibits normal muscle tone. Coordination normal.  Skin: Skin is warm and dry. No rash noted.  Psychiatric: She has a normal mood and affect. Her behavior is normal.          Assessment & Plan:   Probable right-sided pneumonia Atrial fibrillation with rapid ventricular response Profound weight loss Hypertension  Rule out right thyroid nodule  we'll refer to the ED for further evaluation and management

## 2012-10-17 NOTE — ED Notes (Signed)
Per PMD's note, pt's visit dx was SOB, CP, HTN, and a-fib.  Pt denies any cp at this time, but reports R side pain when taking a deep breath.  Pt denies any injury.

## 2012-10-17 NOTE — Patient Instructions (Signed)
Report to the emergency department immediately for further evaluation and admission to the hospital

## 2012-10-17 NOTE — ED Provider Notes (Signed)
History     CSN: [id number]  Arrival date & time 10/17/12  1117   First MD Initiated Contact with Patient 10/17/12 1129      Chief Complaint  Patient presents with  . Shortness of Breath     HPI Pt was seen at 1150.  Per pt and her daughter, c/o gradual onset and worsening of persistent cough for the past 2 weeks.  Has been associated with SOB and right sided posterior ribs "pain."  Pt states she was eval by her PMD today, was told she "might have pneumonia," and was sent to the ED for further eval and admission.  Denies CP/palpitations, no fevers, no abd pain, no N/V/D, no rash, no injury.    Past Medical History  Diagnosis Date  . ANXIETY 05/17/2008  . ATRIAL FIBRILLATION WITH RAPID VENTRICULAR RESPONSE 10/09/2009  . CALLUS, RIGHT FOOT 02/11/2009  . EPISTAXIS, RECURRENT 01/15/2010  . GAIT IMBALANCE 08/16/2008  . GERD 08/08/2007  . HYPERLIPIDEMIA 05/17/2008  . HYPERTENSION 08/08/2007  . OSTEOARTHRITIS 05/17/2008  . SOB 08/18/2007  . WEAKNESS 05/17/2008  . DJD (degenerative joint disease)   . History of hiatal hernia     Past Surgical History  Procedure Date  . Abdominal hysterectomy   . Esophagogastroduodenoscopy     stricture, hiatal hernia ,reflux esopg.  . Right hip fracture October 2009    Dr Charlann Boxer    Family History  Problem Relation Age of Onset  . Hypertension Neg Hx     family    History  Substance Use Topics  . Smoking status: Former Smoker    Quit date: 12/27/1968  . Smokeless tobacco: Never Used  . Alcohol Use: No    Review of Systems ROS: Statement: All systems negative except as marked or noted in the HPI; Constitutional: Negative for fever and chills. ; ; Eyes: Negative for eye pain, redness and discharge. ; ; ENMT: Negative for ear pain, hoarseness, nasal congestion, sinus pressure and sore throat. ; ; Cardiovascular: Negative for chest pain, palpitations, diaphoresis, and peripheral edema. ; ; Respiratory: +SOB, cough, right ribs pain. Negative for  wheezing and stridor. ; ; Gastrointestinal: Negative for nausea, vomiting, diarrhea, abdominal pain, blood in stool, hematemesis, jaundice and rectal bleeding. . ; ; Genitourinary: Negative for dysuria, flank pain and hematuria. ; ; Musculoskeletal: Negative for back pain and neck pain. Negative for swelling and trauma.; ; Skin: Negative for pruritus, rash, abrasions, blisters, bruising and skin lesion.; ; Neuro: Negative for headache, lightheadedness and neck stiffness. Negative for weakness, altered level of consciousness , altered mental status, extremity weakness, paresthesias, involuntary movement, seizure and syncope.       Allergies  Review of patient's allergies indicates no known allergies.  Home Medications   Current Outpatient Rx  Name Route Sig Dispense Refill  . ALBUTEROL SULFATE HFA 108 (90 BASE) MCG/ACT IN AERS Inhalation Inhale 2 puffs into the lungs every 6 (six) hours as needed for wheezing. 18 g 6  . AMLODIPINE BESYLATE 5 MG PO TABS Oral Take 1 tablet (5 mg total) by mouth daily. 90 tablet 6  . BENAZEPRIL HCL 40 MG PO TABS Oral Take 1 tablet (40 mg total) by mouth daily. 90 tablet 6  . FUROSEMIDE 40 MG PO TABS Oral Take 1 tablet (40 mg total) by mouth daily. 90 tablet 6  . LANSOPRAZOLE 30 MG PO CPDR Oral Take 1 capsule (30 mg total) by mouth daily as needed. 90 capsule 6  . LANSOPRAZOLE 30 MG PO CPDR  TAKE ONE CAPSULE BY MOUTH EVERY DAY AS NEEDED 90 capsule 3  . LORAZEPAM 0.5 MG PO TABS Oral Take 1 tablet (0.5 mg total) by mouth daily as needed. 30 tablet 3  . METOPROLOL TARTRATE 100 MG PO TABS  TAKE 1 TABLET BY MOUTH TWICE A DAY 180 tablet 3  . PROMETHAZINE HCL 12.5 MG PO TABS Oral Take 1 tablet (12.5 mg total) by mouth every 8 (eight) hours as needed for nausea. 20 tablet 0    BP 162/85  Pulse 67  Temp 97.9 F (36.6 C) (Oral)  Resp 33  SpO2 100%  Physical Exam 1155: Physical examination:  Nursing notes reviewed; Vital signs and O2 SAT reviewed;  Constitutional:  Well developed, Well nourished, In no acute distress; Head:  Normocephalic, atraumatic; Eyes: EOMI, PERRL, No scleral icterus; ENMT: Mouth and pharynx normal, poor dentition. Mucous membranes dry; Neck: Supple, Full range of motion, No lymphadenopathy; Cardiovascular: Irregular irregular rate and rhythm, No gallop; Respiratory: Breath sounds coarse, esp right, without wheezes.  Speaking full sentences with ease, Normal respiratory effort/excursion; Chest: Nontender, Movement normal; Abdomen: Soft, Nontender, Nondistended, Normal bowel sounds;; Extremities: Pulses normal, No tenderness, No edema, No calf edema or asymmetry.; Neuro: AA&Ox3, Major CN grossly intact.  Speech clear. No gross focal motor or sensory deficits in extremities.; Skin: Color normal, Warm, Dry.   ED Course  Procedures   MDM  MDM Reviewed: previous chart, nursing note and vitals Reviewed previous: labs and ECG Interpretation: labs, ECG and x-ray     Date: 10/17/2012  Rate: 108  Rhythm: atrial fibrillation and premature ventricular contractions (PVC)  QRS Axis: normal  Intervals: normal  ST/T Wave abnormalities: normal  Conduction Disutrbances:none  Narrative Interpretation:   Old EKG Reviewed: unchanged; no significant changes from previous EKG dated 12/22/2011.  Results for orders placed during the hospital encounter of 10/17/12  BASIC METABOLIC PANEL      Component Value Range   Sodium 137  135 - 145 mEq/L   Potassium 3.3 (*) 3.5 - 5.1 mEq/L   Chloride 97  96 - 112 mEq/L   CO2 27  19 - 32 mEq/L   Glucose, Bld 103 (*) 70 - 99 mg/dL   BUN 13  6 - 23 mg/dL   Creatinine, Ser 1.61  0.50 - 1.10 mg/dL   Calcium 8.7  8.4 - 09.6 mg/dL   GFR calc non Af Amer 75 (*) >90 mL/min   GFR calc Af Amer 87 (*) >90 mL/min  LACTIC ACID, PLASMA      Component Value Range   Lactic Acid, Venous 1.9  0.5 - 2.2 mmol/L  TROPONIN I      Component Value Range   Troponin I <0.30  <0.30 ng/mL  URINALYSIS, ROUTINE W REFLEX  MICROSCOPIC      Component Value Range   Color, Urine AMBER (*) YELLOW   APPearance CLOUDY (*) CLEAR   Specific Gravity, Urine 1.022  1.005 - 1.030   pH 5.5  5.0 - 8.0   Glucose, UA NEGATIVE  NEGATIVE mg/dL   Hgb urine dipstick NEGATIVE  NEGATIVE   Bilirubin Urine SMALL (*) NEGATIVE   Ketones, ur TRACE (*) NEGATIVE mg/dL   Protein, ur NEGATIVE  NEGATIVE mg/dL   Urobilinogen, UA 2.0 (*) 0.0 - 1.0 mg/dL   Nitrite NEGATIVE  NEGATIVE   Leukocytes, UA LARGE (*) NEGATIVE  CBC WITH DIFFERENTIAL      Component Value Range   WBC 9.7  4.0 - 10.5 K/uL   RBC 3.81 (*)  3.87 - 5.11 MIL/uL   Hemoglobin 11.1 (*) 12.0 - 15.0 g/dL   HCT 41.3 (*) 24.4 - 01.0 %   MCV 86.6  78.0 - 100.0 fL   MCH 29.1  26.0 - 34.0 pg   MCHC 33.6  30.0 - 36.0 g/dL   RDW 27.2 (*) 53.6 - 64.4 %   Platelets 225  150 - 400 K/uL   Neutrophils Relative 85 (*) 43 - 77 %   Neutro Abs 8.2 (*) 1.7 - 7.7 K/uL   Lymphocytes Relative 5 (*) 12 - 46 %   Lymphs Abs 0.4 (*) 0.7 - 4.0 K/uL   Monocytes Relative 10  3 - 12 %   Monocytes Absolute 1.0  0.1 - 1.0 K/uL   Eosinophils Relative 0  0 - 5 %   Eosinophils Absolute 0.0  0.0 - 0.7 K/uL   Basophils Relative 0  0 - 1 %   Basophils Absolute 0.0  0.0 - 0.1 K/uL  PRO B NATRIURETIC PEPTIDE      Component Value Range   Pro B Natriuretic peptide (BNP) 791.0 (*) 0 - 450 pg/mL  URINE MICROSCOPIC-ADD ON      Component Value Range   Squamous Epithelial / LPF FEW (*) RARE   WBC, UA 21-50  <3 WBC/hpf   Bacteria, UA FEW (*) RARE   Urine-Other MUCOUS PRESENT     Dg Chest 2 View 10/17/2012  *RADIOLOGY REPORT*  Clinical Data: Right chest pain and cough.  CHEST - 2 VIEW  Comparison: 10/25/2008  Findings: There is a moderately large right pleural effusion which appears loculated.  This was not present previously.  There is patchy airspace disease in the right lung base which could be due to pneumonia or mass lesion.  There is COPD with pulmonary hyperinflation.  Left lung is clear. Negative for  heart failure.  IMPRESSION: Advanced COPD.  Loculated right pleural effusion.  Right lower lobe infiltrate or mass is present.  Findings could be due to pneumonia or tumor.  CT chest with contrast may be helpful for further evaluation. Paracentesis may be helpful.   Original Report Authenticated By: Camelia Phenes, M.D.     Results for SENNA, LAPE (MRN [id number]) as of 10/17/2012 15:15  Ref. Range 10/30/2008 04:33 02/11/2009 16:37 04/27/2011 10:50 05/04/2012 10:18 10/17/2012 12:00  Hemoglobin Latest Range: 12.0-15.0 g/dL 63.8 (L) 75.6 43.3 29.5 11.1 (L)  HCT Latest Range: 36.0-46.0 % 30.0 (L) 40.0 40.7 39.5 33.0 (L)    Results for RHONA, FUSILIER (MRN [id number]) as of 10/17/2012 15:15  Ref. Range 08/18/2007 11:46 10/25/2008 18:06 10/17/2012 12:00  Pro B Natriuretic peptide (BNP) Latest Range: 0-450 pg/mL 62.0 114.0 (H) 791.0 (H)     1405:  Will dose IV levaquin for CAP (will also cover UTI).  Pt's O2 Sats drop to 93% on R/A, further decrease to 90% R/A when talking.  Pt also becomes tachypneic and tachycardic.  O2 2L N/C applied with Sats increasing to 96-100%, with tachycardia and tachypnea improving.  Dx and testing d/w pt and family.  Questions answered.  Verb understanding, agreeable to admit.  T/C to [facility] Dr. Irene Limbo, case discussed, including:  HPI, pertinent PM/SHx, VS/PE, dx testing, ED course and treatment:  Agreeable to admit, requests to write temporary orders, obtain tele bed to team 6.           Laray Anger, DO 10/17/12 2036

## 2012-10-17 NOTE — ED Notes (Signed)
a 

## 2012-10-17 NOTE — Progress Notes (Signed)
ANTIBIOTIC CONSULT NOTE - INITIAL  Pharmacy Consult for Zosyn, renal adjustment of antibiotics Indication: pneumonia  No Known Allergies  Patient Measurements: 41.3kg 62in  Vital Signs: Temp: 97.9 F (36.6 C) (10/22 1128) Temp src: Oral (10/22 1128) BP: 162/85 mmHg (10/22 1128) Pulse Rate: 67  (10/22 1128) Intake/Output from previous day:   Intake/Output from this shift:    Labs:  Basename 10/17/12 1200  WBC 9.7  HGB 11.1*  PLT 225  LABCREA --  CREATININE 0.63   CrCl = 62 ml/min/1.10m2 (normalized)  Microbiology: Urine culture pending  Medical History: Past Medical History  Diagnosis Date  . ANXIETY 05/17/2008  . ATRIAL FIBRILLATION WITH RAPID VENTRICULAR RESPONSE 10/09/2009  . CALLUS, RIGHT FOOT 02/11/2009  . EPISTAXIS, RECURRENT 01/15/2010  . GAIT IMBALANCE 08/16/2008  . GERD 08/08/2007  . HYPERLIPIDEMIA 05/17/2008  . HYPERTENSION 08/08/2007  . OSTEOARTHRITIS 05/17/2008  . SOB 08/18/2007  . WEAKNESS 05/17/2008  . DJD (degenerative joint disease)   . History of hiatal hernia     Medications:  Scheduled:   Infusions:    . sodium chloride 75 mL/hr at 10/17/12 1356  . levofloxacin (LEVAQUIN) IV     Assessment:  78 year old woman presented to ED with 2 week history of progressive SOB and DOE. Exam was notable for tachypnea and hypoxia. CXR revealed a loculated right pleural effusion and RLL infiltrate or mass.  Levaquin x 1 dose ordered in ED; Zosyn per Pharmacy on admission orders; Zithromax per MD  Currently afebrile, WBC wnl, SCr wnl, CrCl 62 ml/min  CrCl >64ml/min, therefore standard dose Zosyn is appropriate   Goal of Therapy:  Eradication of infection, appropriate dose for renal function  Plan:   Zosyn 3.375gm IV q8h (4hr extended infusions)  Continue Zithromax as ordered per MD Follow up renal function & cultures De-escalate antibiotics as appropriate  Loralee Pacas, PharmD, BCPS Pager: [phone] 10/17/2012,3:24 PM

## 2012-10-17 NOTE — H&P (Addendum)
History and Physical  HEDI BARKAN GNF:[id number] DOB: [birth year] DOA: 10/17/2012  Referring physician: Samuel Jester, DO PCP: Rogelia Boga, MD   Chief Complaint: short of breath  HPI:  78 year old woman presented to ED with 2 week history of progressive SOB and DOE. Exam was notable for tachypnea and hypoxia. CXR revealed a loculated right pleural effusion and RLL infiltrate or mass.  Illness started with sore throat, shortness of breath, cough and cold symptoms 2 weeks ago. Although cough and sore throat improved, shortness of breath progressively worsened with increased DOE. She had no fever or other associated symptoms. Reported right sided chest pain to ED staff.  She was seen by PCP today where she was found to tachycardia, ongoing weight loss, possible thyroid nodule and suspected pneumonia and was thus referred to ED for further evaluation.  Review of Systems:   Negative for fever, visual changes, rash, new muscle aches, dysuria, bleeding, n/v/abdominal pain. No changes to breasts or masses noted.  Past Medical History  Diagnosis Date  . ANXIETY 05/17/2008  . ATRIAL FIBRILLATION WITH RAPID VENTRICULAR RESPONSE 10/09/2009  . CALLUS, RIGHT FOOT 02/11/2009  . EPISTAXIS, RECURRENT 01/15/2010  . GAIT IMBALANCE 08/16/2008  . GERD 08/08/2007  . HYPERLIPIDEMIA 05/17/2008  . HYPERTENSION 08/08/2007  . OSTEOARTHRITIS 05/17/2008  . SOB 08/18/2007  . WEAKNESS 05/17/2008  . DJD (degenerative joint disease)   . History of hiatal hernia    Past Surgical History  Procedure Date  . Abdominal hysterectomy   . Esophagogastroduodenoscopy     stricture, hiatal hernia ,reflux esopg.  . Right hip fracture October 2009    Dr Charlann Boxer    Social History:  reports that she quit smoking about 43 years ago. She has never used smokeless tobacco. She reports that she does not drink alcohol or use illicit drugs.  No Known Allergies  Family History  Problem Relation Age of Onset  . Hypertension  Neg Hx     family     Prior to Admission medications   Medication Sig Start Date End Date Taking? Authorizing Provider  albuterol (PROVENTIL HFA;VENTOLIN HFA) 108 (90 BASE) MCG/ACT inhaler Inhale 2 puffs into the lungs every 6 (six) hours as needed. wheezing   Yes Historical Provider, MD  amLODipine (NORVASC) 5 MG tablet Take 5 mg by mouth every morning.   Yes Historical Provider, MD  benazepril (LOTENSIN) 40 MG tablet Take 40 mg by mouth every morning.   Yes Historical Provider, MD  furosemide (LASIX) 40 MG tablet Take 40 mg by mouth every morning.   Yes Historical Provider, MD  lansoprazole (PREVACID) 30 MG capsule Take 30 mg by mouth daily as needed. heartburn   Yes Historical Provider, MD  LORazepam (ATIVAN) 0.5 MG tablet Take 0.5 mg by mouth daily as needed. Anxiety   Yes Historical Provider, MD  metoprolol (LOPRESSOR) 100 MG tablet Take 100 mg by mouth 2 (two) times daily.   Yes Historical Provider, MD  promethazine (PHENERGAN) 12.5 MG tablet Take 12.5 mg by mouth every 8 (eight) hours as needed. nausea   Yes Historical Provider, MD  promethazine (PHENERGAN) 12.5 MG tablet Take 1 tablet (12.5 mg total) by mouth every 8 (eight) hours as needed for nausea. 05/04/12 05/11/12  Gordy Savers, MD   Physical Exam: Filed Vitals:   10/17/12 1125 10/17/12 1128  BP:  162/85  Pulse:  67  Temp: 97.9 F (36.6 C) 97.9 F (36.6 C)  TempSrc: Oral Oral  Resp:  33  SpO2:  100%  General:  Examined in ED. Appears calm and comfortable. Eyes: PERRL, normal lids, irises. Bilateral cataracts. ENT: grossly normal hearing, lips & tongue; very poor dentition with obvious caries. Neck: grossly unremarkable Cardiovascular: tachycardic, irregular, no m/r/g. No LE edema. Respiratory: left lung fields CTA, no w/r/r. Right anterior chest clear, posterior chest with decreased breath sounds and rhonchi. Mild increased respiratory effort, tachypneic, non-toxic. Able to speak in full sentences. Breasts:  appear symmetric. Right breast--no masses palpated, overlying skin appears normal, non-tender Abdomen: soft, ntnd Skin: no rash or induration seen on limited exam Musculoskeletal: grossly unremarkable Psychiatric: grossly normal mood and affect, speech fluent and appropriate Neurologic: grossly non-focal.  Wt Readings from Last 3 Encounters:  10/17/12 41.277 kg (91 lb)  05/04/12 47.174 kg (104 lb)  01/10/12 52.164 kg (115 lb)   Labs on Admission:  Basic Metabolic Panel:  Lab 10/17/12 1610  NA 137  K 3.3*  CL 97  CO2 27  GLUCOSE 103*  BUN 13  CREATININE 0.63  CALCIUM 8.7  MG --  PHOS --   CBC:  Lab 10/17/12 1200  WBC 9.7  NEUTROABS 8.2*  HGB 11.1*  HCT 33.0*  MCV 86.6  PLT 225   Cardiac Enzymes:  Lab 10/17/12 1200  CKTOTAL --  CKMB --  CKMBINDEX --  TROPONINI <0.30    Basename 10/17/12 1200  PROBNP 791.0*   Radiological Exams on Admission: Dg Chest 2 View  10/17/2012  *RADIOLOGY REPORT*  Clinical Data: Right chest pain and cough.  CHEST - 2 VIEW  Comparison: 10/25/2008  Findings: There is a moderately large right pleural effusion which appears loculated.  This was not present previously.  There is patchy airspace disease in the right lung base which could be due to pneumonia or mass lesion.  There is COPD with pulmonary hyperinflation.  Left lung is clear. Negative for heart failure.  IMPRESSION: Advanced COPD.  Loculated right pleural effusion.  Right lower lobe infiltrate or mass is present.  Findings could be due to pneumonia or tumor.  CT chest with contrast may be helpful for further evaluation. Paracentesis may be helpful.   Original Report Authenticated By: Camelia Phenes, M.D.     EKG: Independently reviewed. Atrial fibrillation, VR 108. PVC. Cannot rule out anterior infarct, age unknown.   Principal Problem:  *Community acquired pneumonia Active Problems:  HYPERTENSION  Pleural effusion  Atrial fibrillation with RVR  Hypokalemia  Pyuria  Poor  dentition   Assessment/Plan 1. CAP--history most suggestive of infectious etiology. Associated loculated right pleural effusion--CT chest to further evaluate to exclude mass and to characterize effusion, rule out empyema. Further workup as dictated by findings. Given poor dentition will start empiric Zosyn for anaerobe coverage. Doubt atypicals, but Zithromax for now. Based on chronicity and stability doubt MRSA. 2. Atrial fibrillation/RVR--currently stable. Hydrate, extra-dose metoprolol. If uncontrolled, start Cardizem infusion. 3. Hypokalemia--replete. 4. Pyuria--asymptomatic--culture sent. 5. HTN--stable. 6. Poor dentition--outpatient follow-up, consider extraction. 7. Weight loss--concerning for malignancy. Follow-up CT.  Code Status: DNR Family Communication: discussed with daughter at bedside Disposition Plan/Anticipated LOS: home when improved   Time spent: 19 minutes  Brendia Sacks, MD  [facility] Team 6 Pager [phone]. If 8PM-8AM, please contact night-coverage at [email or web address], password [facility] 10/17/2012, 1:57 PM

## 2012-10-18 DIAGNOSIS — I4891 Unspecified atrial fibrillation: Secondary | ICD-10-CM

## 2012-10-18 DIAGNOSIS — J189 Pneumonia, unspecified organism: Principal | ICD-10-CM

## 2012-10-18 LAB — CBC WITH DIFFERENTIAL/PLATELET
HCT: 26.8 % — ABNORMAL LOW (ref 36.0–46.0)
Hemoglobin: 9.1 g/dL — ABNORMAL LOW (ref 12.0–15.0)
Lymphocytes Relative: 11 % — ABNORMAL LOW (ref 12–46)
Lymphs Abs: 0.7 10*3/uL (ref 0.7–4.0)
MCHC: 34 g/dL (ref 30.0–36.0)
Monocytes Absolute: 0.8 10*3/uL (ref 0.1–1.0)
Monocytes Relative: 14 % — ABNORMAL HIGH (ref 3–12)
Neutro Abs: 4.5 10*3/uL (ref 1.7–7.7)
RBC: 3.06 MIL/uL — ABNORMAL LOW (ref 3.87–5.11)
WBC: 6.1 10*3/uL (ref 4.0–10.5)

## 2012-10-18 LAB — BASIC METABOLIC PANEL
BUN: 12 mg/dL (ref 6–23)
CO2: 28 mEq/L (ref 19–32)
Chloride: 103 mEq/L (ref 96–112)
Creatinine, Ser: 0.7 mg/dL (ref 0.50–1.10)
Glucose, Bld: 91 mg/dL (ref 70–99)
Potassium: 3.7 mEq/L (ref 3.5–5.1)

## 2012-10-18 LAB — LEGIONELLA ANTIGEN, URINE: Legionella Antigen, Urine: NEGATIVE

## 2012-10-18 LAB — URINE CULTURE

## 2012-10-18 LAB — EXPECTORATED SPUTUM ASSESSMENT W GRAM STAIN, RFLX TO RESP C

## 2012-10-18 MED ORDER — BOOST PLUS PO LIQD
237.0000 mL | Freq: Two times a day (BID) | ORAL | Status: DC
Start: 2012-10-18 — End: 2012-10-19
  Administered 2012-10-18 – 2012-10-19 (×2): 237 mL via ORAL
  Filled 2012-10-18 (×3): qty 237

## 2012-10-18 MED ORDER — AMOXICILLIN-POT CLAVULANATE 875-125 MG PO TABS
1.0000 | ORAL_TABLET | Freq: Two times a day (BID) | ORAL | Status: DC
  Administered 2012-10-18 – 2012-10-19 (×2): 1 via ORAL
  Filled 2012-10-18 (×3): qty 1

## 2012-10-18 NOTE — Progress Notes (Signed)
[facility]             Progress Note   Subjective: Feels well. Not requiring oxygen. Wants to go home.  Objective: Vital signs in last 24 hours: Temp:  [98 F (36.7 C)-98.8 F (37.1 C)] 98.8 F (37.1 C) (10/23 0534) Pulse Rate:  [70-105] 70  (10/23 0534) Resp:  [18-32] 24  (10/23 0534) BP: (116-163)/(52-88) 119/52 mmHg (10/23 0534) SpO2:  [96 %-100 %] 100 % (10/23 0534) Weight:  [41.277 kg (91 lb)] 41.277 kg (91 lb) (10/22 1700) Weight change:  Last BM Date: 10/17/12  Intake/Output from previous day: 10/22 0701 - 10/23 0700 In: 520 [P.O.:420; IV Piggyback:100] Out: 100 [Urine:100]     Physical Exam: General: Alert, awake, oriented x3, in no acute distress. HEENT: No bruits, no goiter. Heart: Regular rate and rhythm, without murmurs, rubs, gallops. Lungs: Decreased breath sounds at the right base. Abdomen: Soft, nontender, nondistended, positive bowel sounds. Extremities: No clubbing cyanosis or edema with positive pedal pulses. Neuro: Grossly intact, nonfocal.    Lab Results: Basic Metabolic Panel:  Basename 10/18/12 0416 10/17/12 1200  NA 137 137  K 3.7 3.3*  CL 103 97  CO2 28 27  GLUCOSE 91 103*  BUN 12 13  CREATININE 0.70 0.63  CALCIUM 8.2* 8.7  MG -- --  PHOS -- --   CBC:  Basename 10/18/12 0416 10/17/12 1200  WBC 6.1 9.7  NEUTROABS 4.5 8.2*  HGB 9.1* 11.1*  HCT 26.8* 33.0*  MCV 87.6 86.6  PLT 204 225   Cardiac Enzymes:  Basename 10/17/12 1200  CKTOTAL --  CKMB --  CKMBINDEX --  TROPONINI <0.30   BNP:  Basename 10/17/12 1200  PROBNP 791.0*   Urinalysis:  Basename 10/17/12 1215  COLORURINE AMBER*  LABSPEC 1.022  PHURINE 5.5  GLUCOSEU NEGATIVE  HGBUR NEGATIVE  BILIRUBINUR SMALL*  KETONESUR TRACE*  PROTEINUR NEGATIVE  UROBILINOGEN 2.0*  NITRITE NEGATIVE  LEUKOCYTESUR LARGE*    Recent Results (from the past 240 hour(s))  CULTURE, EXPECTORATED SPUTUM-ASSESSMENT     Status: Normal   Collection Time   10/18/12  9:56 AM      Component Value Range Status Comment   Specimen Description SPUTUM   Final    Special Requests NONE   Final    Sputum evaluation     Final    Value: THIS SPECIMEN IS ACCEPTABLE. RESPIRATORY CULTURE REPORT TO FOLLOW.   Report Status 10/18/2012 FINAL   Final     Studies/Results: Dg Chest 2 View  10/17/2012  *RADIOLOGY REPORT*  Clinical Data: Right chest pain and cough.  CHEST - 2 VIEW  Comparison: 10/25/2008  Findings: There is a moderately large right pleural effusion which appears loculated.  This was not present previously.  There is patchy airspace disease in the right lung base which could be due to pneumonia or mass lesion.  There is COPD with pulmonary hyperinflation.  Left lung is clear. Negative for heart failure.  IMPRESSION: Advanced COPD.  Loculated right pleural effusion.  Right lower lobe infiltrate or mass is present.  Findings could be due to pneumonia or tumor.  CT chest with contrast may be helpful for further evaluation. Paracentesis may be helpful.   Original Report Authenticated By: Camelia Phenes, M.D.    Ct Chest W Contrast  10/17/2012  *RADIOLOGY REPORT*  Clinical Data: Loculated effusion.  Right lower lobe infiltrate.  CT CHEST WITH CONTRAST  Technique:  Multidetector CT imaging of the chest was performed following the standard  protocol during bolus administration of intravenous contrast.  Contrast: 80mL OMNIPAQUE IOHEXOL 300 MG/ML  SOLN  Comparison: 10/17/2012  Findings: Aortic and branch vessel atherosclerosis observed.  There is calcification in the left main coronary artery.  Right lower paratracheal node anterior to the carina measures 1.4 cm in short axis.  Subcarinal node measures 2.2 cm in short axis. Small epicardial lymph nodes are present.  Right lateral loculated pleural effusion noted.  There is also some posteromedial loculation as on image 20 of series 2.  Severe emphysema noted.  There is airspace opacity primarily representing volume loss  in the right middle lobe, although a component of infiltrative opacity is also present.  Air bronchograms are noted and within the right lower lobe there is a fluid density 1.6 cm structure on image 36 of series 2 which could represent the necrotic portion of a right middle lobe mass, or conceivably a pulmonary abscess.  This is surrounded by atelectatic lung.  Atelectatic portions the right lower lobe forming a thick band along the loculated pleural effusion.  There are some faint calcific densities in the collapsed portions of the right lower lobe and right middle lobe.  Both the right lower lobe and right middle lobe bronchial lumens appear thinned/attenuated on images 25- 30 of series 8, potentially from endobronchial contents or marked airway thickening.  Irregular bronchiectasis, primarily cylindrical and saccular, noted in the right lower lobe.  Old right posterior rib fractures noted.  Prominent diverticulosis noted at the splenic flexure.  IMPRESSION:  1.  Pathologic right lower paratracheal and subcarinal adenopathy associated with loculated right pleural effusion (likely exudative) and regions of volume loss and consolidation in the right middle lobe and right lower lobe.  In the right middle lobe, a small region of fluid density within the lung could be due to abscess or necrotic pulmonary nodule/mass. 2.  Luminal narrowing in the right middle lobe and right lower lobe bronchi likely due to airway thickening and plugging, and less likely due to tumor, with resulting bronchiectasis and patchy opacities particularly in the right lower lobe.  The right middle lobe is primarily atelectatic. 3.  Mild calcification in the left main coronary artery. 4.  Prominent diverticulosis of the splenic flexure.   Original Report Authenticated By: Dellia Cloud, M.D.     Medications: Scheduled Meds:   . sodium chloride   Intravenous STAT  . amLODipine  5 mg Oral q morning - 10a  . azithromycin  500 mg  Intravenous Q24H  . benazepril  40 mg Oral q morning - 10a  . furosemide  40 mg Oral q morning - 10a  . levofloxacin (LEVAQUIN) IV  750 mg Intravenous Once  . metoprolol  100 mg Oral BID  . pantoprazole  40 mg Oral Daily  . piperacillin-tazobactam (ZOSYN)  IV  3.375 g Intravenous Q8H  . potassium chloride  40 mEq Oral BID  . sodium chloride  3 mL Intravenous Q12H   Continuous Infusions:   . DISCONTD: sodium chloride 75 mL/hr at 10/17/12 1356   PRN Meds:.sodium chloride, albuterol, iohexol, LORazepam, ondansetron (ZOFRAN) IV, sodium chloride  Assessment/Plan:  Principal Problem:  *Community acquired pneumonia Active Problems:  HYPERTENSION  Pleural effusion  Atrial fibrillation with RVR  Hypokalemia  Pyuria  Poor dentition    CAP -Strep pneumo/legionella urine antigens are negative. -She remains afebrile and without leukocytosis. -No oxygen requirements. -Agree with some anaerobic coverage given poor dentition. -CT scan results reviewed: PNA and a loculated effusion noted. -I  would recommend treating for 14 days and repeating a CT scan of the chest in 6 weeks. -Given she is doing remarkably well clinically, I would defer on CT surgery consult for her loculated effusion, given the only solution would be VATS and I think this would be highly problematic in her given her age and other medical co morbidities. -If however the effusion were to persist or she would become symptomatic again after full treatment of her PNA, CT surgery consultation may be indicated. -DC zosyn and start augmentin.  AFIB -Currently in NSR at rate of 76 on telemetry.   Time spent coordinating care: 35 minutes.   LOS: 1 day   [facility] Pager: [phone] 10/18/2012, 12:44 PM

## 2012-10-18 NOTE — Progress Notes (Signed)
INITIAL ADULT NUTRITION ASSESSMENT Date: 10/18/2012   Time: 2:00 PM Reason for Assessment: Nutrition risk and low BMI  ASSESSMENT: Female 78 y.o.  Dx: Community acquired pneumonia  Hx:  Past Medical History  Diagnosis Date  . ANXIETY 05/17/2008  . ATRIAL FIBRILLATION WITH RAPID VENTRICULAR RESPONSE 10/09/2009  . CALLUS, RIGHT FOOT 02/11/2009  . EPISTAXIS, RECURRENT 01/15/2010  . GAIT IMBALANCE 08/16/2008  . GERD 08/08/2007  . HYPERLIPIDEMIA 05/17/2008  . HYPERTENSION 08/08/2007  . OSTEOARTHRITIS 05/17/2008  . SOB 08/18/2007  . WEAKNESS 05/17/2008  . DJD (degenerative joint disease)   . History of hiatal hernia    Past Surgical History  Procedure Date  . Abdominal hysterectomy   . Esophagogastroduodenoscopy     stricture, hiatal hernia ,reflux esopg.  . Right hip fracture October 2009    Dr Charlann Boxer    Related Meds:     . sodium chloride   Intravenous STAT  . amLODipine  5 mg Oral q morning - 10a  . amoxicillin-clavulanate  1 tablet Oral Q12H  . azithromycin  500 mg Intravenous Q24H  . benazepril  40 mg Oral q morning - 10a  . furosemide  40 mg Oral q morning - 10a  . levofloxacin (LEVAQUIN) IV  750 mg Intravenous Once  . metoprolol  100 mg Oral BID  . pantoprazole  40 mg Oral Daily  . potassium chloride  40 mEq Oral BID  . sodium chloride  3 mL Intravenous Q12H  . DISCONTD: piperacillin-tazobactam (ZOSYN)  IV  3.375 g Intravenous Q8H    Ht: 5\' 2"  (157.5 cm)  Wt: 91 lb (41.277 kg)  Ideal Wt: 50.1 kg  % Ideal Wt: 82   Usual Wt:  Wt Readings from Last 10 Encounters:  10/17/12 91 lb (41.277 kg)  10/17/12 91 lb (41.277 kg)  05/04/12 104 lb (47.174 kg)  01/10/12 115 lb (52.164 kg)  12/22/11 116 lb (52.617 kg)  12/22/11 116 lb (52.617 kg)  11/04/11 114 lb (51.71 kg)  04/27/11 116 lb (52.617 kg)  10/19/10 116 lb (52.617 kg)  04/16/10 116 lb (52.617 kg)    % Usual Wt: 79% of weight 9 months ago  Body mass index is 16.64 kg/(m^2).-underweight  Labs:  CMP       Component Value Date/Time   NA 137 10/18/2012 0416   K 3.7 10/18/2012 0416   CL 103 10/18/2012 0416   CO2 28 10/18/2012 0416   GLUCOSE 91 10/18/2012 0416   BUN 12 10/18/2012 0416   CREATININE 0.70 10/18/2012 0416   CALCIUM 8.2* 10/18/2012 0416   PROT 6.5 05/04/2012 1018   ALBUMIN 3.2* 05/04/2012 1018   AST 19 05/04/2012 1018   ALT 10 05/04/2012 1018   ALKPHOS 78 05/04/2012 1018   BILITOT 0.6 05/04/2012 1018   GFRNONAA 72* 10/18/2012 0416   GFRAA 84* 10/18/2012 0416    I/O last 3 completed shifts: In: 520 [P.O.:420; IV Piggyback:100] Out: 100 [Urine:100]   Diet Order: Cardiac  Supplements/Tube Feeding:  none  IVF:    DISCONTD: sodium chloride Last Rate: 75 mL/hr at 10/17/12 1356    Estimated Nutritional Needs:   Kcal: 1150-1250 Protein: 50-60 gm Fluid: 1.2L  Food/Nutrition Related Hx: Pt states that she has had a poor appetite for a very long time. Complaints of taste alterations.  Drank Boost in the past but not recently.  Does not tolerate milk (lactose intolerant).  Ensure causes diarrhea.  Poor dentition as well that effects intake.  Eating Lunch.  Intake inadequate.  Pt  very thin with obvious muscle loss and decreased body fat.  Intake has been <75% for >1 month.  Pt meets criteria for severe malnutrition related to chronic illness.  NUTRITION DIAGNOSIS: -Inadequate oral intake (NI-2.1).  Status: Ongoing  RELATED TO: poor appetite  AS EVIDENCE BY: observation and documentation  MONITORING/EVALUATION(Goals): Intake, labs, weight Goal:  Intake of >60% meals and supplements  EDUCATION NEEDS: -No education needs identified at this time  INTERVENTION: Encouraged intake, provide preferences Boost bid  Dietitian [phone]  DOCUMENTATION CODES Per approved criteria  -Severe malnutrition in the context of chronic illness    Derrell Lolling Anastasia Fiedler 10/18/2012, 2:00 PM

## 2012-10-18 NOTE — Care Management Note (Unsigned)
    Page 1 of 1   10/18/2012     1:49:44 PM   CARE MANAGEMENT NOTE 10/18/2012  Patient:  Cynthia Graham, Cynthia Graham   Account Number:  [id number]  Date Initiated:  10/18/2012  Documentation initiated by:  Lanier Clam  Subjective/Objective Assessment:   ADMITTED W/PNA.PLEURAL EFFUSION.     Action/Plan:   FROM HOME W/DTR.   Anticipated DC Date:  10/23/2012   Anticipated DC Plan:  HOME W HOME HEALTH SERVICES      DC Planning Services  CM consult      Choice offered to / List presented to:             Status of service:  In process, will continue to follow Medicare Important Message given?   (If response is "NO", the following Medicare IM given date fields will be blank) Date Medicare IM given:   Date Additional Medicare IM given:    Discharge Disposition:    Per UR Regulation:  Reviewed for med. necessity/level of care/duration of stay  If discussed at Long Length of Stay Meetings, dates discussed:    Comments:  10/18/12 Xzavior Reinig RN,BSN NCM [phone] PCCM CONS.RECOMMEND PT/OT EVAL WHEN MD FEEL APPROPRIATE.

## 2012-10-19 LAB — CBC
Hemoglobin: 8.9 g/dL — ABNORMAL LOW (ref 12.0–15.0)
MCHC: 33.5 g/dL (ref 30.0–36.0)
Platelets: 199 10*3/uL (ref 150–400)
RBC: 3.04 MIL/uL — ABNORMAL LOW (ref 3.87–5.11)

## 2012-10-19 MED ORDER — AMOXICILLIN-POT CLAVULANATE 500-125 MG PO TABS: 1.0000 | ORAL_TABLET | Freq: Three times a day (TID) | ORAL | Status: DC

## 2012-10-19 MED ORDER — AMOXICILLIN-POT CLAVULANATE 875-125 MG PO TABS: 1.0000 | ORAL_TABLET | Freq: Two times a day (BID) | ORAL | Status: DC

## 2012-10-19 NOTE — Discharge Summary (Addendum)
Physician Discharge Summary  Patient ID: Cynthia Graham MRN: [id number] DOB/AGE: [birth year] 78 y.o.  Admit date: 10/17/2012 Discharge date: 11/19/2012  Primary Care Physician:  Rogelia Boga, MD   Discharge Diagnoses:    Principal Problem:  *Community acquired pneumonia Active Problems:  HYPERTENSION  Pleural effusion  Atrial fibrillation with RVR  Hypokalemia  Pyuria  Poor dentition  Malnutrition      Medication List     As of 11/19/2012 11:03 AM    TAKE these medications         albuterol 108 (90 BASE) MCG/ACT inhaler   Commonly known as: PROVENTIL HFA;VENTOLIN HFA   Inhale 2 puffs into the lungs every 6 (six) hours as needed. wheezing      amLODipine 5 MG tablet   Commonly known as: NORVASC   Take 5 mg by mouth every morning.      amoxicillin-clavulanate 500-125 MG per tablet   Commonly known as: AUGMENTIN   Take 1 tablet (500 mg total) by mouth 3 (three) times daily.      benazepril 40 MG tablet   Commonly known as: LOTENSIN   Take 40 mg by mouth every morning.      furosemide 40 MG tablet   Commonly known as: LASIX   Take 40 mg by mouth every morning.      lansoprazole 30 MG capsule   Commonly known as: PREVACID   Take 30 mg by mouth daily as needed. heartburn      metoprolol 100 MG tablet   Commonly known as: LOPRESSOR   Take 100 mg by mouth 2 (two) times daily.      promethazine 12.5 MG tablet   Commonly known as: PHENERGAN   Take 12.5 mg by mouth every 8 (eight) hours as needed. nausea          Disposition and Follow-up:  Will be discharged home today in stable and improved condition to complete 10 days of Augmentin. Will need repeat CT Chest in 6 weeks (see below for details).  Consults:  None   Significant Diagnostic Studies:  No results found.  Brief H and P: For complete details please refer to admission H and P, but in brief patient is a 78 year old woman presented to ED with 2 week history of progressive SOB and DOE.  Exam was notable for tachypnea and hypoxia. CXR revealed a loculated right pleural effusion and RLL infiltrate or mass. Illness started with sore throat, shortness of breath, cough and cold symptoms 2 weeks ago. Although cough and sore throat improved, shortness of breath progressively worsened with increased DOE. She had no fever or other associated symptoms. Reported right sided chest pain to ED staff. She was seen by PCP today where she was found to tachycardia, ongoing weight loss, possible thyroid nodule and suspected pneumonia and was thus referred to ED for further evaluation.     Hospital Course:  Principal Problem:  *Community acquired pneumonia Active Problems:  HYPERTENSION  Pleural effusion  Atrial fibrillation with RVR  Hypokalemia  Pyuria  Poor dentition  Malnutrition    CAP  -Strep pneumo/legionella urine antigens are negative.  -She has remained afebrile and without leukocytosis.  -No oxygen requirements.  -Agree with some anaerobic coverage given poor dentition.  -CT scan results reviewed: PNA and a loculated effusion noted. Unable to exclude mass. -I would recommend treating for 14 days and repeating a CT scan of the chest in 6 weeks.  -Given she is doing remarkably well clinically, I would defer  on CT surgery consult for her loculated effusion, given the only solution would be VATS and I think this would be highly problematic in her given her age and other medical co morbidities.  -If however the effusion were to persist or she would become symptomatic again after full treatment of her PNA, CT surgery consultation may be indicated.  -Will be discharged home today on 10 more days of Augmentin.  AFIB  -Currently in NSR at rate of 76 on telemetry.    Time spent on Discharge: Greater than 30 minutes.  SignedChaya Jan [facility] Pager: [phone] 11/19/2012, 11:03 AM

## 2012-10-21 LAB — CULTURE, RESPIRATORY W GRAM STAIN: Culture: NORMAL

## 2012-11-06 ENCOUNTER — Other Ambulatory Visit: Payer: Self-pay | Admitting: Internal Medicine

## 2012-11-06 NOTE — Telephone Encounter (Signed)
ok 

## 2012-11-06 NOTE — Telephone Encounter (Signed)
Med filled.  

## 2012-11-06 NOTE — Telephone Encounter (Signed)
Refill request for Ativan. This is a historical med, pt last seen on 10/17/12. Please advise.

## 2012-11-19 DIAGNOSIS — E46 Unspecified protein-calorie malnutrition: Secondary | ICD-10-CM

## 2012-11-30 ENCOUNTER — Encounter: Payer: Self-pay | Admitting: Internal Medicine

## 2012-11-30 ENCOUNTER — Ambulatory Visit (INDEPENDENT_AMBULATORY_CARE_PROVIDER_SITE_OTHER): Payer: Medicare Other | Admitting: Internal Medicine

## 2012-11-30 VITALS — BP 122/80 | HR 60 | Temp 97.3°F | Resp 22 | Wt 90.0 lb

## 2012-11-30 DIAGNOSIS — I10 Essential (primary) hypertension: Secondary | ICD-10-CM

## 2012-11-30 DIAGNOSIS — J90 Pleural effusion, not elsewhere classified: Secondary | ICD-10-CM

## 2012-11-30 DIAGNOSIS — J189 Pneumonia, unspecified organism: Secondary | ICD-10-CM

## 2012-11-30 DIAGNOSIS — I4891 Unspecified atrial fibrillation: Secondary | ICD-10-CM

## 2012-11-30 NOTE — Progress Notes (Signed)
Subjective:    Patient ID: Cynthia Graham, female    DOB: 1917-01-12, 78 y.o.   MRN: [id number]  HPI  Wt Readings from Last 3 Encounters:  11/30/12 90 lb (40.824 kg)  10/19/12 91 lb 3.2 oz (41.368 kg)  10/17/12 91 lb (41.32 kg)   78 year old patient who is seen following a hospital discharge. She was admitted to hospital or possibly 5 weeks ago with a suspected community acquired pneumonia and atrial fibrillation with a rapid ventricular response. CT scan of the chest revealed pathological right paratracheal and subcarinal nodes with volume loss and a pleural effusion. Emphysematous and bronchiectatic changes were also noted.  The patient has completed antibiotic therapy and in general feels quite well. No shortness of breath cough or weakness. She has been unable to gain weight in spite of Ensure  and a good appetite.  The discharge plan was to repeat a followup chest CT in 6 weeks  Past Medical History  Diagnosis Date  . ANXIETY 05/17/2008  . ATRIAL FIBRILLATION WITH RAPID VENTRICULAR RESPONSE 10/09/2009  . CALLUS, RIGHT FOOT 02/11/2009  . EPISTAXIS, RECURRENT 01/15/2010  . GAIT IMBALANCE 08/16/2008  . GERD 08/08/2007  . HYPERLIPIDEMIA 05/17/2008  . HYPERTENSION 08/08/2007  . OSTEOARTHRITIS 05/17/2008  . SOB 08/18/2007  . WEAKNESS 05/17/2008  . DJD (degenerative joint disease)   . History of hiatal hernia     History   Social History  . Marital Status: Married    Spouse Name: N/A    Number of Children: N/A  . Years of Education: N/A   Occupational History  . Not on file.   Social History Main Topics  . Smoking status: Former Smoker    Types: Cigarettes    Quit date: 12/27/1968  . Smokeless tobacco: Never Used  . Alcohol Use: No  . Drug Use: No  . Sexually Active: No   Other Topics Concern  . Not on file   Social History Narrative  . No narrative on file    Past Surgical History  Procedure Date  . Abdominal hysterectomy   . Esophagogastroduodenoscopy     stricture,  hiatal hernia ,reflux esopg.  . Right hip fracture October 2009    Dr Charlann Boxer    Family History  Problem Relation Age of Onset  . Hypertension Neg Hx     family    No Known Allergies  Current Outpatient Prescriptions on File Prior to Visit  Medication Sig Dispense Refill  . albuterol (PROVENTIL HFA;VENTOLIN HFA) 108 (90 BASE) MCG/ACT inhaler Inhale 2 puffs into the lungs every 6 (six) hours as needed. wheezing      . amLODipine (NORVASC) 5 MG tablet Take 5 mg by mouth every morning.      Marland Kitchen amoxicillin-clavulanate (AUGMENTIN) 500-125 MG per tablet Take 1 tablet (500 mg total) by mouth 3 (three) times daily.  20 tablet  0  . benazepril (LOTENSIN) 40 MG tablet Take 40 mg by mouth every morning.      . furosemide (LASIX) 40 MG tablet Take 40 mg by mouth every morning.      . lansoprazole (PREVACID) 30 MG capsule Take 30 mg by mouth daily as needed. heartburn      . LORazepam (ATIVAN) 0.5 MG tablet TAKE 1 TABLET BY MOUTH EVERY DAY AS NEEDED  30 tablet  0  . metoprolol (LOPRESSOR) 100 MG tablet Take 100 mg by mouth 2 (two) times daily.      . promethazine (PHENERGAN) 12.5 MG tablet Take 12.5 mg by  mouth every 8 (eight) hours as needed. nausea        BP 122/80  Pulse 60  Temp 97.3 F (36.3 C) (Oral)  Resp 22  Wt 90 lb (40.824 kg)  SpO2 97%        Review of Systems  Constitutional: Negative.   HENT: Negative for hearing loss, congestion, sore throat, rhinorrhea, dental problem, sinus pressure and tinnitus.   Eyes: Negative for pain, discharge and visual disturbance.  Respiratory: Negative for cough and shortness of breath.   Cardiovascular: Negative for chest pain, palpitations and leg swelling.  Gastrointestinal: Negative for nausea, vomiting, abdominal pain, diarrhea, constipation, blood in stool and abdominal distention.  Genitourinary: Negative for dysuria, urgency, frequency, hematuria, flank pain, vaginal bleeding, vaginal discharge, difficulty urinating, vaginal pain and  pelvic pain.  Musculoskeletal: Negative for joint swelling, arthralgias and gait problem.  Skin: Negative for rash.  Neurological: Negative for dizziness, syncope, speech difficulty, weakness, numbness and headaches.  Hematological: Negative for adenopathy.  Psychiatric/Behavioral: Negative for behavioral problems, dysphoric mood and agitation. The patient is not nervous/anxious.        Objective:   Physical Exam  Constitutional: She is oriented to person, place, and time. She appears well-developed and well-nourished.       Elderly no distress Evidence of weight loss with bitemporal wasting Blood pressure normal Weight 90 pounds Afebrile O2 saturation 97%  HENT:  Head: Normocephalic.  Right Ear: External ear normal.  Left Ear: External ear normal.  Mouth/Throat: Oropharynx is clear and moist.       Poor dental hygiene  Eyes: Conjunctivae normal and EOM are normal. Pupils are equal, round, and reactive to light.  Neck: Normal range of motion. Neck supple. No thyromegaly present.  Cardiovascular: Normal rate, regular rhythm, normal heart sounds and intact distal pulses.   Pulmonary/Chest: Effort normal.       Left chest was clear The right lower lung reveal diminished breath sounds with evidence of volume loss. Coarse rales or rhonchi still present  Abdominal: Soft. Bowel sounds are normal. She exhibits no mass. There is no tenderness.  Musculoskeletal: Normal range of motion.  Lymphadenopathy:    She has no cervical adenopathy.  Neurological: She is alert and oriented to person, place, and time.  Skin: Skin is warm and dry. No rash noted.  Psychiatric: She has a normal mood and affect. Her behavior is normal.          Assessment & Plan:   Status post recent community-acquired pneumonia; patient still has clinical evidence of volume loss and/ or pleural effusion.  Prior CT scan revealed pathological right paratracheal and subcarinal lymph nodes.  We'll followup chest CT;  will likely need referral for a tissue diagnosis History of atrial fibrillation with rapid ventricular response. Presently normal sinus rhythm Hypertension stable  Followup chest CT Probably will need pulmonary referral for bronchoscopy

## 2012-11-30 NOTE — Patient Instructions (Addendum)
Followup chest CT scan as discussed  MiraLax (polyethylene glycol) daily as needed for constipation

## 2012-12-04 ENCOUNTER — Other Ambulatory Visit: Payer: Self-pay | Admitting: Internal Medicine

## 2012-12-05 ENCOUNTER — Ambulatory Visit (INDEPENDENT_AMBULATORY_CARE_PROVIDER_SITE_OTHER)
Admission: RE | Admit: 2012-12-05 | Discharge: 2012-12-05 | Disposition: A | Discharge status: Home / Self Care | Payer: Medicare Other | Source: Ambulatory Visit | Attending: Internal Medicine | Admitting: Internal Medicine

## 2012-12-05 DIAGNOSIS — J189 Pneumonia, unspecified organism: Secondary | ICD-10-CM

## 2012-12-05 DIAGNOSIS — J90 Pleural effusion, not elsewhere classified: Secondary | ICD-10-CM

## 2013-01-18 ENCOUNTER — Other Ambulatory Visit: Payer: Self-pay | Admitting: Internal Medicine

## 2013-03-30 ENCOUNTER — Encounter: Payer: Self-pay | Admitting: Internal Medicine

## 2013-03-30 ENCOUNTER — Ambulatory Visit (INDEPENDENT_AMBULATORY_CARE_PROVIDER_SITE_OTHER): Payer: Medicare Other | Admitting: Internal Medicine

## 2013-03-30 VITALS — BP 100/70 | HR 60 | Temp 97.6°F | Resp 18 | Wt 94.0 lb

## 2013-03-30 DIAGNOSIS — J189 Pneumonia, unspecified organism: Secondary | ICD-10-CM

## 2013-03-30 DIAGNOSIS — E785 Hyperlipidemia, unspecified: Secondary | ICD-10-CM

## 2013-03-30 DIAGNOSIS — I10 Essential (primary) hypertension: Secondary | ICD-10-CM

## 2013-03-30 DIAGNOSIS — M1990 Unspecified osteoarthritis, unspecified site: Secondary | ICD-10-CM

## 2013-03-30 NOTE — Progress Notes (Signed)
  Subjective:    Patient ID: Cynthia Graham, female    DOB: Jul 05, 1917, 79 y.o.   MRN: [id number]  HPI  79 year old patient who is seen today for followup. She was hospitalized in the fall for right lower lobe pneumonia with pleural effusion. Complications included atrial fibrillation with rapid ventricular response. Followup chest CT in December revealed near resolution of the pneumonia and effusion. Clinically the patient continues to do quite well. She has treated hypertension. No concerns or complaints today  Wt Readings from Last 3 Encounters:  03/30/13 94 lb (42.638 kg)  11/30/12 90 lb (40.824 kg)  10/19/12 91 lb 3.2 oz (41.368 kg)      Review of Systems  Constitutional: Negative.   HENT: Negative for hearing loss, congestion, sore throat, rhinorrhea, dental problem, sinus pressure and tinnitus.   Eyes: Negative for pain, discharge and visual disturbance.  Respiratory: Negative for cough and shortness of breath.   Cardiovascular: Negative for chest pain, palpitations and leg swelling.  Gastrointestinal: Negative for nausea, vomiting, abdominal pain, diarrhea, constipation, blood in stool and abdominal distention.  Genitourinary: Negative for dysuria, urgency, frequency, hematuria, flank pain, vaginal bleeding, vaginal discharge, difficulty urinating, vaginal pain and pelvic pain.  Musculoskeletal: Negative for joint swelling, arthralgias and gait problem.  Skin: Negative for rash.  Neurological: Negative for dizziness, syncope, speech difficulty, weakness, numbness and headaches.  Hematological: Negative for adenopathy.  Psychiatric/Behavioral: Negative for behavioral problems, dysphoric mood and agitation. The patient is not nervous/anxious.        Objective:   Physical Exam  Constitutional: She is oriented to person, place, and time. She appears well-developed and well-nourished.  HENT:  Head: Normocephalic.  Right Ear: External ear normal.  Left Ear: External ear normal.   Mouth/Throat: Oropharynx is clear and moist.  Numerous dental caries  Eyes: Conjunctivae and EOM are normal. Pupils are equal, round, and reactive to light.  Neck: Normal range of motion. Neck supple. No thyromegaly present.  Cardiovascular: Normal rate, regular rhythm, normal heart sounds and intact distal pulses.   Pulmonary/Chest: Effort normal. She has rales.  Continues to have rales right lower lobe  Abdominal: Soft. Bowel sounds are normal. She exhibits no mass. There is no tenderness.  Musculoskeletal: Normal range of motion.  Lymphadenopathy:    She has no cervical adenopathy.  Neurological: She is alert and oriented to person, place, and time.  Skin: Skin is warm and dry. No rash noted.  Psychiatric: She has a normal mood and affect. Her behavior is normal.          Assessment & Plan:    HTN S/p CAP  Stable  ROV 4 mon

## 2013-03-30 NOTE — Patient Instructions (Signed)
Limit your sodium (Salt) intake  Return in 4 months for follow-up  

## 2013-06-10 ENCOUNTER — Encounter (HOSPITAL_COMMUNITY): Payer: Self-pay | Admitting: Emergency Medicine

## 2013-06-10 ENCOUNTER — Inpatient Hospital Stay (HOSPITAL_COMMUNITY)
Admission: EM | Admit: 2013-06-10 | Discharge: 2013-06-12 | DRG: 378 | Disposition: A | Discharge status: Home / Self Care | Payer: Medicare Other | Attending: Family Medicine | Admitting: Family Medicine

## 2013-06-10 DIAGNOSIS — K5731 Diverticulosis of large intestine without perforation or abscess with bleeding: Principal | ICD-10-CM | POA: Diagnosis present

## 2013-06-10 DIAGNOSIS — I4892 Unspecified atrial flutter: Secondary | ICD-10-CM | POA: Diagnosis present

## 2013-06-10 DIAGNOSIS — I4891 Unspecified atrial fibrillation: Secondary | ICD-10-CM

## 2013-06-10 DIAGNOSIS — E876 Hypokalemia: Secondary | ICD-10-CM

## 2013-06-10 DIAGNOSIS — K089 Disorder of teeth and supporting structures, unspecified: Secondary | ICD-10-CM

## 2013-06-10 DIAGNOSIS — L84 Corns and callosities: Secondary | ICD-10-CM

## 2013-06-10 DIAGNOSIS — E785 Hyperlipidemia, unspecified: Secondary | ICD-10-CM

## 2013-06-10 DIAGNOSIS — R5381 Other malaise: Secondary | ICD-10-CM

## 2013-06-10 DIAGNOSIS — R040 Epistaxis: Secondary | ICD-10-CM

## 2013-06-10 DIAGNOSIS — M1990 Unspecified osteoarthritis, unspecified site: Secondary | ICD-10-CM

## 2013-06-10 DIAGNOSIS — K922 Gastrointestinal hemorrhage, unspecified: Secondary | ICD-10-CM

## 2013-06-10 DIAGNOSIS — J90 Pleural effusion, not elsewhere classified: Secondary | ICD-10-CM

## 2013-06-10 DIAGNOSIS — F411 Generalized anxiety disorder: Secondary | ICD-10-CM

## 2013-06-10 DIAGNOSIS — R7989 Other specified abnormal findings of blood chemistry: Secondary | ICD-10-CM

## 2013-06-10 DIAGNOSIS — R0602 Shortness of breath: Secondary | ICD-10-CM

## 2013-06-10 DIAGNOSIS — R8281 Pyuria: Secondary | ICD-10-CM

## 2013-06-10 DIAGNOSIS — Z7982 Long term (current) use of aspirin: Secondary | ICD-10-CM

## 2013-06-10 DIAGNOSIS — I10 Essential (primary) hypertension: Secondary | ICD-10-CM

## 2013-06-10 DIAGNOSIS — Z8673 Personal history of transient ischemic attack (TIA), and cerebral infarction without residual deficits: Secondary | ICD-10-CM

## 2013-06-10 DIAGNOSIS — J189 Pneumonia, unspecified organism: Secondary | ICD-10-CM

## 2013-06-10 DIAGNOSIS — R5383 Other fatigue: Secondary | ICD-10-CM

## 2013-06-10 DIAGNOSIS — K219 Gastro-esophageal reflux disease without esophagitis: Secondary | ICD-10-CM

## 2013-06-10 DIAGNOSIS — D62 Acute posthemorrhagic anemia: Secondary | ICD-10-CM

## 2013-06-10 DIAGNOSIS — I959 Hypotension, unspecified: Secondary | ICD-10-CM

## 2013-06-10 DIAGNOSIS — K921 Melena: Secondary | ICD-10-CM

## 2013-06-10 DIAGNOSIS — Z87891 Personal history of nicotine dependence: Secondary | ICD-10-CM

## 2013-06-10 DIAGNOSIS — K625 Hemorrhage of anus and rectum: Secondary | ICD-10-CM

## 2013-06-10 DIAGNOSIS — Z66 Do not resuscitate: Secondary | ICD-10-CM | POA: Diagnosis present

## 2013-06-10 DIAGNOSIS — Z79899 Other long term (current) drug therapy: Secondary | ICD-10-CM

## 2013-06-10 DIAGNOSIS — R269 Unspecified abnormalities of gait and mobility: Secondary | ICD-10-CM

## 2013-06-10 DIAGNOSIS — D696 Thrombocytopenia, unspecified: Secondary | ICD-10-CM

## 2013-06-10 DIAGNOSIS — E46 Unspecified protein-calorie malnutrition: Secondary | ICD-10-CM

## 2013-06-10 DIAGNOSIS — I951 Orthostatic hypotension: Secondary | ICD-10-CM | POA: Diagnosis present

## 2013-06-10 DIAGNOSIS — D649 Anemia, unspecified: Secondary | ICD-10-CM

## 2013-06-10 HISTORY — DX: Cerebral infarction, unspecified: I63.9

## 2013-06-10 LAB — COMPREHENSIVE METABOLIC PANEL
ALT: 11 U/L (ref 0–35)
AST: 14 U/L (ref 0–37)
Alkaline Phosphatase: 101 U/L (ref 39–117)
CO2: 26 mEq/L (ref 19–32)
Calcium: 8.6 mg/dL (ref 8.4–10.5)
Chloride: 103 mEq/L (ref 96–112)
GFR calc Af Amer: 83 mL/min — ABNORMAL LOW (ref >90)
GFR calc non Af Amer: 72 mL/min — ABNORMAL LOW (ref >90)
Glucose, Bld: 195 mg/dL — ABNORMAL HIGH (ref 70–99)
Sodium: 141 mEq/L (ref 135–145)
Total Bilirubin: 0.2 mg/dL — ABNORMAL LOW (ref 0.3–1.2)

## 2013-06-10 LAB — CBC WITH DIFFERENTIAL/PLATELET
Basophils Absolute: 0 10*3/uL (ref 0.0–0.1)
Eosinophils Relative: 0 % (ref 0–5)
HCT: 32.8 % — ABNORMAL LOW (ref 36.0–46.0)
Lymphocytes Relative: 16 % (ref 12–46)
Lymphs Abs: 1.7 10*3/uL (ref 0.7–4.0)
MCV: 90.1 fL (ref 78.0–100.0)
Neutro Abs: 8.3 10*3/uL — ABNORMAL HIGH (ref 1.7–7.7)
Platelets: 127 10*3/uL — ABNORMAL LOW (ref 150–400)
RBC: 3.64 MIL/uL — ABNORMAL LOW (ref 3.87–5.11)
RDW: 14.7 % (ref 11.5–15.5)
WBC: 10.5 10*3/uL (ref 4.0–10.5)

## 2013-06-10 LAB — CG4 I-STAT (LACTIC ACID): Lactic Acid, Venous: 5.16 mmol/L — ABNORMAL HIGH (ref 0.5–2.2)

## 2013-06-10 MED ORDER — POTASSIUM CHLORIDE 10 MEQ/100ML IV SOLN
10.0000 meq | Freq: Once | INTRAVENOUS | Status: AC
  Administered 2013-06-11: 10 meq via INTRAVENOUS
  Filled 2013-06-10: qty 100

## 2013-06-10 MED ORDER — SODIUM CHLORIDE 0.9 % IV SOLN
1000.0000 mL | INTRAVENOUS | Status: DC
  Administered 2013-06-11 – 2013-06-12 (×4): 1000 mL via INTRAVENOUS

## 2013-06-10 MED ORDER — POTASSIUM CHLORIDE 10 MEQ/100ML IV SOLN
10.0000 meq | Freq: Once | INTRAVENOUS | Status: AC
  Administered 2013-06-10: 10 meq via INTRAVENOUS
  Filled 2013-06-10: qty 100

## 2013-06-10 MED ORDER — SODIUM CHLORIDE 0.9 % IV SOLN
1000.0000 mL | Freq: Once | INTRAVENOUS | Status: AC
  Administered 2013-06-11: 1000 mL via INTRAVENOUS

## 2013-06-10 NOTE — ED Notes (Signed)
Dr Preston Fleeting notified of positive orthostatic vital signs  Admitting dr in room with pt

## 2013-06-10 NOTE — ED Notes (Signed)
Per EMS pt states about 4pm pt had a BM with significant amt of blood noted in it  Pt decided to wait to see if it happened again and about 2030 pt had a second episode in which the pt strained and woke up on the floor  EMS states there was blood in the toilet and on the bathroom floor  Pt is not c/o any pain or shortness of breath, nausea, or vomiting  Family was at the house with pt when EMS arrived

## 2013-06-10 NOTE — ED Notes (Signed)
Family at bedside. 

## 2013-06-10 NOTE — ED Provider Notes (Signed)
History     CSN: [id number]  Arrival date & time 06/10/13  2157   First MD Initiated Contact with Patient 06/10/13 2255      Chief Complaint  Patient presents with  . Rectal Bleeding    (Consider location/radiation/quality/duration/timing/severity/associated sxs/prior treatment) Patient is a 79 y.o. female presenting with hematochezia. The history is provided by the patient.  Rectal Bleeding She had onset rectal bleeding at 4 PM today. She states that she has passed a large amount of dark red blood. On one occasion, she passed out while on the commode. She denies abdominal pain. There's been nausea but no vomiting. Other than the one time she passed out, she denies dizziness. She denies chest pain. She takes low dose aspirin but is on no other anticoagulants.  Past Medical History  Diagnosis Date  . ANXIETY 05/17/2008  . ATRIAL FIBRILLATION WITH RAPID VENTRICULAR RESPONSE 10/09/2009  . CALLUS, RIGHT FOOT 02/11/2009  . EPISTAXIS, RECURRENT 01/15/2010  . GAIT IMBALANCE 08/16/2008  . GERD 08/08/2007  . HYPERLIPIDEMIA 05/17/2008  . HYPERTENSION 08/08/2007  . OSTEOARTHRITIS 05/17/2008  . SOB 08/18/2007  . WEAKNESS 05/17/2008  . DJD (degenerative joint disease)   . History of hiatal hernia   . Stroke     Past Surgical History  Procedure Laterality Date  . Abdominal hysterectomy    . Esophagogastroduodenoscopy      stricture, hiatal hernia ,reflux esopg.  . Right hip fracture  October 2009    Dr Charlann Boxer    Family History  Problem Relation Age of Onset  . Hypertension Neg Hx     family    History  Substance Use Topics  . Smoking status: Former Smoker    Types: Cigarettes    Quit date: 12/27/1968  . Smokeless tobacco: Never Used  . Alcohol Use: No    OB History   Grav Para Term Preterm Abortions TAB SAB Ect Mult Living                  Review of Systems  Gastrointestinal: Positive for hematochezia.  All other systems reviewed and are negative.    Allergies  Review  of patient's allergies indicates no known allergies.  Home Medications   Current Outpatient Rx  Name  Route  Sig  Dispense  Refill  . albuterol (PROVENTIL HFA;VENTOLIN HFA) 108 (90 BASE) MCG/ACT inhaler   Inhalation   Inhale 2 puffs into the lungs every 6 (six) hours as needed. wheezing         . amLODipine (NORVASC) 5 MG tablet   Oral   Take 5 mg by mouth every morning.         Marland Kitchen aspirin EC 81 MG tablet   Oral   Take 81 mg by mouth daily.         . benazepril (LOTENSIN) 40 MG tablet   Oral   Take 40 mg by mouth every morning.         . furosemide (LASIX) 40 MG tablet   Oral   Take 40 mg by mouth daily.         . lansoprazole (PREVACID) 30 MG capsule   Oral   Take 30 mg by mouth daily as needed. heartburn         . LORazepam (ATIVAN) 0.5 MG tablet   Oral   Take 0.5 mg by mouth every 8 (eight) hours as needed for anxiety.         . metoprolol (LOPRESSOR) 100 MG tablet  Oral   Take 100 mg by mouth 2 (two) times daily.         . Multiple Vitamin (MULTIVITAMIN WITH MINERALS) TABS   Oral   Take 1 tablet by mouth daily.         . promethazine (PHENERGAN) 12.5 MG tablet   Oral   Take 12.5 mg by mouth every 8 (eight) hours as needed. nausea           BP 124/77  Pulse 97  Temp(Src) 97.6 F (36.4 C) (Oral)  Resp 20  SpO2 100%  Physical Exam  Nursing note and vitals reviewed.   79 year old female, resting comfortably and in no acute distress. Vital signs are normal. Oxygen saturation is 100%, which is normal. Head is normocephalic and atraumatic. PERRLA, EOMI. Oropharynx is clear. Arcus senilis present. Conjunctivae are slightly pale. Neck is nontender and supple without adenopathy or JVD. Back is nontender and there is no CVA tenderness. Lungs are clear without rales, wheezes, or rhonchi. Chest is nontender. Heart is irregularly irregular without murmur. Abdomen is soft, flat, nontender without masses or hepatosplenomegaly and peristalsis is  normoactive. Rectal: Normal sphincter tone, no masses. Moderate amount of bright red blood present and has been sent for Hemoccult testing. Extremities have no cyanosis or edema, full range of motion is present. Skin is warm and dry without rash. Neurologic: Mental status is normal, cranial nerves are intact, there are no motor or sensory deficits.  ED Course  Procedures (including critical care time)  Results for orders placed during the hospital encounter of 06/10/13  CBC WITH DIFFERENTIAL      Result Value Range   WBC 10.5  4.0 - 10.5 K/uL   RBC 3.64 (*) 3.87 - 5.11 MIL/uL   Hemoglobin 10.5 (*) 12.0 - 15.0 g/dL   HCT 41.6 (*) 60.6 - 30.1 %   MCV 90.1  78.0 - 100.0 fL   MCH 28.8  26.0 - 34.0 pg   MCHC 32.0  30.0 - 36.0 g/dL   RDW 60.1  09.3 - 23.5 %   Platelets 127 (*) 150 - 400 K/uL   Neutrophils Relative % 79 (*) 43 - 77 %   Neutro Abs 8.3 (*) 1.7 - 7.7 K/uL   Lymphocytes Relative 16  12 - 46 %   Lymphs Abs 1.7  0.7 - 4.0 K/uL   Monocytes Relative 4  3 - 12 %   Monocytes Absolute 0.5  0.1 - 1.0 K/uL   Eosinophils Relative 0  0 - 5 %   Eosinophils Absolute 0.0  0.0 - 0.7 K/uL   Basophils Relative 0  0 - 1 %   Basophils Absolute 0.0  0.0 - 0.1 K/uL  COMPREHENSIVE METABOLIC PANEL      Result Value Range   Sodium 141  135 - 145 mEq/L   Potassium 3.0 (*) 3.5 - 5.1 mEq/L   Chloride 103  96 - 112 mEq/L   CO2 26  19 - 32 mEq/L   Glucose, Bld 195 (*) 70 - 99 mg/dL   BUN 19  6 - 23 mg/dL   Creatinine, Ser 5.73  0.50 - 1.10 mg/dL   Calcium 8.6  8.4 - 22.0 mg/dL   Total Protein 6.1  6.0 - 8.3 g/dL   Albumin 2.9 (*) 3.5 - 5.2 g/dL   AST 14  0 - 37 U/L   ALT 11  0 - 35 U/L   Alkaline Phosphatase 101  39 - 117 U/L  Total Bilirubin 0.2 (*) 0.3 - 1.2 mg/dL   GFR calc non Af Amer 72 (*) >90 mL/min   GFR calc Af Amer 83 (*) >90 mL/min  PROTIME-INR      Result Value Range   Prothrombin Time 13.7  11.6 - 15.2 seconds   INR 1.06  0.00 - 1.49  CG4 I-STAT (LACTIC ACID)      Result  Value Range   Lactic Acid, Venous 5.16 (*) 0.5 - 2.2 mmol/L  TYPE AND SCREEN      Result Value Range   ABO/RH(D) O POS     Antibody Screen PENDING     Sample Expiration 06/13/2013       Date: 06/10/2013  Rate: 74  Rhythm: atrial fibrillation  QRS Axis: normal  Intervals: normal  ST/T Wave abnormalities: normal  Conduction Disutrbances:none  Narrative Interpretation:   Old EKG Reviewed: unchanged atrial fibrillation. When compared with ECG of 10/17/2012, no significant changes are seen.    1. Rectal bleeding   2. Anemia   3. Atrial fibrillation   4. Thrombocytopenia   5. Hypokalemia   6. Elevated lactic acid level       MDM  Rectal bleeding-source unclear. Atrial fibrillation. Review of old records shows that she has had atrial fibrillation in the past. She is not on any anticoagulants or antiplatelet agents other low dose aspirin. Orthostatic vital signs will be checked. She will need to be admitted for monitoring hemoglobin and GI workup.  Hemoglobin has come back actually slightly higher than her baseline but it may need time to equilibrate. Potassium is come back low and potassium is being repleted with intravenous potassium run. Mild thrombocytopenia is present and not clinically significant. Case is discussed with Dr. Julian Reil of [facility] who agrees to admit the patient to telemetry bed.  Patient is noted to have significant orthostatic blood pressure drop and she's given IV fluids. Lactic acid level is moderately elevated-also indicating a need for more of fluid resuscitation    Dione Booze, MD 06/10/13 2352

## 2013-06-11 DIAGNOSIS — D62 Acute posthemorrhagic anemia: Secondary | ICD-10-CM | POA: Diagnosis present

## 2013-06-11 DIAGNOSIS — I4891 Unspecified atrial fibrillation: Secondary | ICD-10-CM | POA: Diagnosis present

## 2013-06-11 DIAGNOSIS — I959 Hypotension, unspecified: Secondary | ICD-10-CM

## 2013-06-11 DIAGNOSIS — K922 Gastrointestinal hemorrhage, unspecified: Secondary | ICD-10-CM | POA: Diagnosis present

## 2013-06-11 LAB — CBC
HCT: 22.4 % — ABNORMAL LOW (ref 36.0–46.0)
Hemoglobin: 7.5 g/dL — ABNORMAL LOW (ref 12.0–15.0)
MCH: 29.9 pg (ref 26.0–34.0)
MCHC: 33.5 g/dL (ref 30.0–36.0)
MCV: 89.2 fL (ref 78.0–100.0)
Platelets: 85 K/uL — ABNORMAL LOW (ref 150–400)
RBC: 2.51 MIL/uL — ABNORMAL LOW (ref 3.87–5.11)
RDW: 14.8 % (ref 11.5–15.5)
WBC: 8.3 K/uL (ref 4.0–10.5)

## 2013-06-11 LAB — OCCULT BLOOD, POC DEVICE: Fecal Occult Bld: POSITIVE — AB

## 2013-06-11 LAB — BASIC METABOLIC PANEL
CO2: 24 mEq/L (ref 19–32)
Chloride: 110 mEq/L (ref 96–112)
Potassium: 3.9 mEq/L (ref 3.5–5.1)
Sodium: 142 mEq/L (ref 135–145)

## 2013-06-11 LAB — HEMOGLOBIN AND HEMATOCRIT, BLOOD
HCT: 32.5 % — ABNORMAL LOW (ref 36.0–46.0)
Hemoglobin: 11.4 g/dL — ABNORMAL LOW (ref 12.0–15.0)

## 2013-06-11 MED ORDER — ONDANSETRON HCL 4 MG/2ML IJ SOLN: 4.0000 mg | Freq: Three times a day (TID) | INTRAMUSCULAR | Status: AC | PRN

## 2013-06-11 MED ORDER — BENAZEPRIL HCL 40 MG PO TABS
40.0000 mg | ORAL_TABLET | Freq: Every morning | ORAL | Status: DC
  Administered 2013-06-11 – 2013-06-12 (×2): 40 mg via ORAL
  Filled 2013-06-11 (×2): qty 1

## 2013-06-11 MED ORDER — BOOST / RESOURCE BREEZE PO LIQD
1.0000 | Freq: Two times a day (BID) | ORAL | Status: DC
  Administered 2013-06-12: 1 via ORAL

## 2013-06-11 MED ORDER — PROMETHAZINE HCL 25 MG PO TABS: 12.5000 mg | ORAL_TABLET | Freq: Three times a day (TID) | ORAL | Status: DC | PRN

## 2013-06-11 MED ORDER — SODIUM CHLORIDE 0.9 % IJ SOLN
3.0000 mL | Freq: Two times a day (BID) | INTRAMUSCULAR | Status: DC
  Administered 2013-06-11: 3 mL via INTRAVENOUS

## 2013-06-11 MED ORDER — ASPIRIN EC 81 MG PO TBEC: 81.0000 mg | DELAYED_RELEASE_TABLET | Freq: Every day | ORAL | Status: DC

## 2013-06-11 MED ORDER — SODIUM CHLORIDE 0.9 % IV SOLN
80.0000 mg | Freq: Once | INTRAVENOUS | Status: AC
  Administered 2013-06-11: 80 mg via INTRAVENOUS
  Filled 2013-06-11: qty 80

## 2013-06-11 MED ORDER — SODIUM CHLORIDE 0.9 % IV SOLN
8.0000 mg/h | INTRAVENOUS | Status: DC
  Administered 2013-06-11 – 2013-06-12 (×4): 8 mg/h via INTRAVENOUS
  Filled 2013-06-11 (×8): qty 80

## 2013-06-11 MED ORDER — PEG-KCL-NACL-NASULF-NA ASC-C 100 G PO SOLR
0.5000 | Freq: Two times a day (BID) | ORAL | Status: AC
  Administered 2013-06-11 – 2013-06-12 (×2): 50 g via ORAL
  Filled 2013-06-11: qty 1

## 2013-06-11 MED ORDER — AMLODIPINE BESYLATE 5 MG PO TABS
5.0000 mg | ORAL_TABLET | Freq: Every morning | ORAL | Status: DC
  Administered 2013-06-11 – 2013-06-12 (×2): 5 mg via ORAL
  Filled 2013-06-11 (×2): qty 1

## 2013-06-11 MED ORDER — ADULT MULTIVITAMIN W/MINERALS CH
1.0000 | ORAL_TABLET | Freq: Every day | ORAL | Status: DC
  Administered 2013-06-11 – 2013-06-12 (×2): 1 via ORAL
  Filled 2013-06-11 (×2): qty 1

## 2013-06-11 MED ORDER — SODIUM CHLORIDE 0.9 % IV SOLN: INTRAVENOUS | Status: AC

## 2013-06-11 MED ORDER — RESOURCE INSTANT PROTEIN PO PWD PACKET
1.0000 | Freq: Three times a day (TID) | ORAL | Status: DC
  Administered 2013-06-12: 6 g via ORAL
  Filled 2013-06-11 (×4): qty 6

## 2013-06-11 MED ORDER — METOPROLOL TARTRATE 100 MG PO TABS
100.0000 mg | ORAL_TABLET | Freq: Two times a day (BID) | ORAL | Status: DC
  Administered 2013-06-11 – 2013-06-12 (×3): 100 mg via ORAL
  Filled 2013-06-11 (×4): qty 1

## 2013-06-11 MED ORDER — LORAZEPAM 0.5 MG PO TABS: 0.5000 mg | ORAL_TABLET | Freq: Three times a day (TID) | ORAL | Status: DC | PRN

## 2013-06-11 MED ORDER — ALBUTEROL SULFATE HFA 108 (90 BASE) MCG/ACT IN AERS: 2.0000 | INHALATION_SPRAY | Freq: Four times a day (QID) | RESPIRATORY_TRACT | Status: DC | PRN

## 2013-06-11 MED ORDER — PANTOPRAZOLE SODIUM 40 MG IV SOLR: 40.0000 mg | Freq: Two times a day (BID) | INTRAVENOUS | Status: DC

## 2013-06-11 MED ORDER — SODIUM CHLORIDE 0.9 % IV SOLN
80.0000 mg | Freq: Once | INTRAVENOUS | Status: DC
  Filled 2013-06-11: qty 80

## 2013-06-11 NOTE — H&P (Signed)
[facility] History and Physical  Cynthia Graham ZOX:[id number] DOB: 02/11/1917 DOA: 06/10/2013  Referring physician: ED PCP: Rogelia Boga, MD   Chief Complaint: BRBPR  HPI: Cynthia Graham is a 79 y.o. female who presents to the ED with hematochezia.  She reports her first episode occurred at 4pm today with a large amount of dark red blood and formed stool.  This reoccurred and she passed out on the commode at one point.  She has had nausea but no vomiting.  On no anticoagulants other than low dose ASA though she does have a h/o A.Fib.  In the ED her HGB was 10.5 which is actually up from 8.9 last October, rectal exam demonstrated BRBPR and the patient had orthostatic hypotension, her SBP dropped from 120s to 70s upon standing.  Hospitalist asked to admit.  Review of Systems: 12 systems reviewed and otherwise negative.  Past Medical History  Diagnosis Date  . ANXIETY 05/17/2008  . ATRIAL FIBRILLATION WITH RAPID VENTRICULAR RESPONSE 10/09/2009  . CALLUS, RIGHT FOOT 02/11/2009  . EPISTAXIS, RECURRENT 01/15/2010  . GAIT IMBALANCE 08/16/2008  . GERD 08/08/2007  . HYPERLIPIDEMIA 05/17/2008  . HYPERTENSION 08/08/2007  . OSTEOARTHRITIS 05/17/2008  . SOB 08/18/2007  . WEAKNESS 05/17/2008  . DJD (degenerative joint disease)   . History of hiatal hernia   . Stroke    Past Surgical History  Procedure Laterality Date  . Abdominal hysterectomy    . Esophagogastroduodenoscopy      stricture, hiatal hernia ,reflux esopg.  . Right hip fracture  October 2009    Dr Charlann Boxer   Social History:  reports that she quit smoking about 44 years ago. Her smoking use included Cigarettes. She smoked 0.00 packs per day. She has never used smokeless tobacco. She reports that she does not drink alcohol or use illicit drugs.   No Known Allergies  Family History  Problem Relation Age of Onset  . Hypertension Neg Hx     family    Prior to Admission medications   Medication Sig Start Date End Date  Taking? Authorizing Provider  albuterol (PROVENTIL HFA;VENTOLIN HFA) 108 (90 BASE) MCG/ACT inhaler Inhale 2 puffs into the lungs every 6 (six) hours as needed. wheezing   Yes Historical Provider, MD  amLODipine (NORVASC) 5 MG tablet Take 5 mg by mouth every morning.   Yes Historical Provider, MD  aspirin EC 81 MG tablet Take 81 mg by mouth daily.   Yes Historical Provider, MD  benazepril (LOTENSIN) 40 MG tablet Take 40 mg by mouth every morning.   Yes Historical Provider, MD  furosemide (LASIX) 40 MG tablet Take 40 mg by mouth daily.   Yes Historical Provider, MD  lansoprazole (PREVACID) 30 MG capsule Take 30 mg by mouth daily as needed. heartburn   Yes Historical Provider, MD  LORazepam (ATIVAN) 0.5 MG tablet Take 0.5 mg by mouth every 8 (eight) hours as needed for anxiety.   Yes Historical Provider, MD  metoprolol (LOPRESSOR) 100 MG tablet Take 100 mg by mouth 2 (two) times daily.   Yes Historical Provider, MD  Multiple Vitamin (MULTIVITAMIN WITH MINERALS) TABS Take 1 tablet by mouth daily.   Yes Historical Provider, MD  promethazine (PHENERGAN) 12.5 MG tablet Take 12.5 mg by mouth every 8 (eight) hours as needed. nausea   Yes Historical Provider, MD   Physical Exam: Filed Vitals:   06/10/13 2228 06/10/13 2343 06/10/13 2344 06/10/13 2344  BP: 124/77 135/72 142/65 75/54  Pulse: 97 92 104 118  Temp:  97.6 F (36.4 C)     TempSrc: Oral     Resp: 20     SpO2: 100%       General:  NAD, resting comfortably in bed Eyes: PEERLA EOMI ENT: mucous membranes moist Neck: supple w/o JVD Cardiovascular: irregularly irregular w/o MRG Respiratory: CTA B Abdomen: soft, nt, nd, bs+ Skin: no rash nor lesion Musculoskeletal: MAE, full ROM all 4 extremities Psychiatric: normal tone and affect Neurologic: AAOx3, grossly non-focal  Labs on Admission:  Basic Metabolic Panel:  Recent Labs Lab 06/10/13 2233  NA 141  K 3.0*  CL 103  CO2 26  GLUCOSE 195*  BUN 19  CREATININE 0.71  CALCIUM 8.6    Liver Function Tests:  Recent Labs Lab 06/10/13 2233  AST 14  ALT 11  ALKPHOS 101  BILITOT 0.2*  PROT 6.1  ALBUMIN 2.9*   No results found for this basename: LIPASE, AMYLASE,  in the last 168 hours No results found for this basename: AMMONIA,  in the last 168 hours CBC:  Recent Labs Lab 06/10/13 2233  WBC 10.5  NEUTROABS 8.3*  HGB 10.5*  HCT 32.8*  MCV 90.1  PLT 127*   Cardiac Enzymes: No results found for this basename: CKTOTAL, CKMB, CKMBINDEX, TROPONINI,  in the last 168 hours  BNP (last 3 results)  Recent Labs  10/17/12 1200  PROBNP 791.0*   CBG: No results found for this basename: GLUCAP,  in the last 168 hours  Radiological Exams on Admission: No results found.  EKG: Independently reviewed. A.Fib, rate controlled.  Assessment/Plan Principal Problem:   GI bleed Active Problems:   HYPERTENSION   Atrial fibrillation and flutter   Acute blood loss anemia   1. GI bleed - needs GI consult in AM, hemodynamically stable but does have orthostatic hypotension in the ED, getting IVF bolus then rate for resuscitation, next CBC at 0500 then at noon, will transfuse if indicated, ice chips only by mouth as it is possible GI may wish to do EGD tomorrow.  Have put patient on PPI gtt especially given h/o GERD in the past but suspect that this is probably a LGIB given the appearance of the blood, diverticular bleed is at the top of the DDX followed by neoplasm which still needs to be ruled out of course.  Tele monitor ordered. 2. HTN - continue home meds except lasix 3. A.Fib - continue rate control meds, not on anticoagulants. 4. Acute blood loss anemia - as above, monitor and transfuse if indicated.    Code Status: DNR - discussed and confirmed with patient (must indicate code status--if unknown or must be presumed, indicate so) Family Communication: Spoke with family at bedside (indicate person spoken with, if applicable, with phone number if by  telephone) Disposition Plan: Admit to inpatient (indicate anticipated LOS)  Time spent: 70 min  GARDNER, JARED M. [facility] Pager [phone]  If 7PM-7AM, please contact night-coverage [email or web address] Password [facility] 06/11/2013, 12:11 AM

## 2013-06-11 NOTE — Progress Notes (Addendum)
Patient has had 5 large bright red bloody bm's since admitted to floor 2 1/2 hours ago. BP stable at 127/62. Admitting MD paged with this information to make sure he is aware. Will continue to monitor. Ginny Forth Spoke with Dr Julian Reil at approx [phone] and will get stat H & Robbie Lis, Jesusita Oka

## 2013-06-11 NOTE — Care Management Note (Addendum)
    Page 1 of 1   06/13/2013     10:50:09 AM   CARE MANAGEMENT NOTE 06/13/2013  Patient:  Cynthia Graham, Cynthia Graham   Account Number:  [id number]  Date Initiated:  06/11/2013  Documentation initiated by:  Lanier Clam  Subjective/Objective Assessment:   ADMITTED W/GIB.ZO:XWRU.     Action/Plan:   FROM HOME W/DTR.HAS PCP,PHARMACY.   Anticipated DC Date:  06/12/2013   Anticipated DC Plan:  HOME/SELF CARE      DC Planning Services  CM consult      Choice offered to / List presented to:             Status of service:  Completed, signed off Medicare Important Message given?   (If response is "NO", the following Medicare IM given date fields will be blank) Date Medicare IM given:   Date Additional Medicare IM given:    Discharge Disposition:  HOME/SELF CARE  Per UR Regulation:  Reviewed for med. necessity/level of care/duration of stay  If discussed at Long Length of Stay Meetings, dates discussed:    Comments:  06/12/13 Shakeitha Umbaugh RN,BSN NCM [phone] D/C HOME NO ORDERS OR NEEDS.  06/11/13 Semiyah Newgent RN,BSN NCM [phone] GI FOLLOWING.HGB-7.5-2UPRBC.COLONOSCOPY IN AM.

## 2013-06-11 NOTE — Progress Notes (Addendum)
INITIAL NUTRITION ASSESSMENT  DOCUMENTATION CODES Per approved criteria  -Underweight   INTERVENTION: Provide Resource Breeze BID Provide Beneprotein TID Provide Ensure Complete, Magic Cup, and Mighty shake once diet is advanced Continue Multivitamin with minerals daily   NUTRITION DIAGNOSIS: Inadequate oral intake related to poor appetite as evidenced by BMI of 16.4 and pt's report of eating very little.   Goal: Pt to meet >/= 90% of their estimated nutrition needs  Monitor:  PO intake Weight Labs  Reason for Assessment: Malnutrition Screening Tool, score of 3  79 y.o. female  Admitting Dx: GI bleed  ASSESSMENT: 79 y.o. female who presents to the ED with hematochezia. She reports her first episode occurred at 4pm today with a large amount of dark red blood and formed stool. Per RN note pt had 5 large bright red bloody bm's within 2 1/2 hours of being admitted to floor. Pt reports that she weighed 130 lbs over 1 year ago; her appetite has been poor for at least 1 year and she has gradually lost weight. Pt reports that she eats very little at home and drinks Ensure Clear.   Height: Ht Readings from Last 1 Encounters:  06/11/13 5\' 3"  (1.6 m)    Weight: Wt Readings from Last 1 Encounters:  06/11/13 92 lb 9.5 oz (42 kg)    Ideal Body Weight: 115 lbs  % Ideal Body Weight: 80%  Wt Readings from Last 10 Encounters:  06/11/13 92 lb 9.5 oz (42 kg)  03/30/13 94 lb (42.638 kg)  11/30/12 90 lb (40.824 kg)  10/19/12 91 lb 3.2 oz (41.368 kg)  10/17/12 91 lb (41.277 kg)  05/04/12 104 lb (47.174 kg)  01/10/12 115 lb (52.164 kg)  12/22/11 116 lb (52.617 kg)  12/22/11 116 lb (52.617 kg)  11/04/11 114 lb (51.71 kg)    Usual Body Weight: 94 lbs  % Usual Body Weight: 98%  BMI:  Body mass index is 16.41 kg/(m^2).  Estimated Nutritional Needs: Kcal: 1610-9604 Protein: 50-59 grams Fluid: > 1.3 L  Skin: WDL  Nutrition Focused Physical Exam:  Subcutaneous Fat:   Orbital Region: mild wasting Upper Arm Region: moderate wasting Thoracic and Lumbar Region: NA  Muscle:  Temple Region: severe wasting Clavicle Bone Region: mild wasting Clavicle and Acromion Bone Region: mild wasting Scapular Bone Region: NA Dorsal Hand: moderate wasting Patellar Region: severe wasting Anterior Thigh Region: severe wasting Posterior Calf Region: NA  Edema: none  Diet Order: Clear Liquid  EDUCATION NEEDS: -No education needs identified at this time   Intake/Output Summary (Last 24 hours) at 06/11/13 1711 Last data filed at 06/11/13 1500  Gross per 24 hour  Intake 3176.58 ml  Output    400 ml  Net 2776.58 ml    Last BM: 6/16   Labs:   Recent Labs Lab 06/10/13 2233 06/11/13 0410  NA 141 142  K 3.0* 3.9  CL 103 110  CO2 26 24  BUN 19 19  CREATININE 0.71 0.58  CALCIUM 8.6 7.4*  GLUCOSE 195* 132*    CBG (last 3)  No results found for this basename: GLUCAP,  in the last 72 hours  Scheduled Meds: . sodium chloride   Intravenous STAT  . amLODipine  5 mg Oral q morning - 10a  . benazepril  40 mg Oral q morning - 10a  . metoprolol  100 mg Oral BID  . multivitamin with minerals  1 tablet Oral Daily  . [START ON 06/14/2013] pantoprazole (PROTONIX) IV  40 mg Intravenous  Q12H  . peg 3350 powder  0.5 kit Oral BID  . sodium chloride  3 mL Intravenous Q12H    Continuous Infusions: . sodium chloride 1,000 mL (06/11/13 1222)  . pantoprozole (PROTONIX) infusion 8 mg/hr (06/11/13 1220)    Past Medical History  Diagnosis Date  . ANXIETY 05/17/2008  . ATRIAL FIBRILLATION WITH RAPID VENTRICULAR RESPONSE 10/09/2009  . CALLUS, RIGHT FOOT 02/11/2009  . EPISTAXIS, RECURRENT 01/15/2010  . GAIT IMBALANCE 08/16/2008  . GERD 08/08/2007  . HYPERLIPIDEMIA 05/17/2008  . HYPERTENSION 08/08/2007  . OSTEOARTHRITIS 05/17/2008  . SOB 08/18/2007  . WEAKNESS 05/17/2008  . DJD (degenerative joint disease)   . History of hiatal hernia   . Stroke     Past Surgical  History  Procedure Laterality Date  . Abdominal hysterectomy    . Esophagogastroduodenoscopy      stricture, hiatal hernia ,reflux esopg.  . Right hip fracture  October 2009    Dr Teodoro Spray RD, LDN Inpatient Clinical Dietitian Pager: [phone] After Hours Pager: [phone]

## 2013-06-11 NOTE — Consult Note (Signed)
Consult for [facility]  Reason for Consult: Hematochezia Referring Physician: [facility]  BECKIE VISCARDI HPI: This is a 79 year old female with a distant history of hematochezia who presents with painless bleeding.  Her bleeding started acutely at 4 PM yesterday and she continued to have blood episodes at home.  She subsequently presented to the ER and she was noted to be orthostatic.  Her BP dropped from 120 to 70 mmHg.  No complaints of chest pain or vomiting, but she does have some mild nausea.  The patient reports having several colonoscopies in the past and the last time she had a colonoscopy was years ago.  She cannot recall the findings.  There is a history of mild constipation.  No reports of NSAID use and there is no history of abdominal aortic aneurysms.    Past Medical History  Diagnosis Date  . ANXIETY 05/17/2008  . ATRIAL FIBRILLATION WITH RAPID VENTRICULAR RESPONSE 10/09/2009  . CALLUS, RIGHT FOOT 02/11/2009  . EPISTAXIS, RECURRENT 01/15/2010  . GAIT IMBALANCE 08/16/2008  . GERD 08/08/2007  . HYPERLIPIDEMIA 05/17/2008  . HYPERTENSION 08/08/2007  . OSTEOARTHRITIS 05/17/2008  . SOB 08/18/2007  . WEAKNESS 05/17/2008  . DJD (degenerative joint disease)   . History of hiatal hernia   . Stroke     Past Surgical History  Procedure Laterality Date  . Abdominal hysterectomy    . Esophagogastroduodenoscopy      stricture, hiatal hernia ,reflux esopg.  . Right hip fracture  October 2009    Dr Charlann Boxer    Family History  Problem Relation Age of Onset  . Hypertension Neg Hx     family    Social History:  reports that she quit smoking about 44 years ago. Her smoking use included Cigarettes. She smoked 0.00 packs per day. She has never used smokeless tobacco. She reports that she does not drink alcohol or use illicit drugs.  Allergies: No Known Allergies  Medications:  Scheduled: . sodium chloride   Intravenous STAT  . amLODipine  5 mg Oral q morning - 10a  . benazepril  40 mg  Oral q morning - 10a  . metoprolol  100 mg Oral BID  . multivitamin with minerals  1 tablet Oral Daily  . [START ON 06/14/2013] pantoprazole (PROTONIX) IV  40 mg Intravenous Q12H  . sodium chloride  3 mL Intravenous Q12H   Continuous: . sodium chloride 1,000 mL (06/11/13 0228)  . pantoprozole (PROTONIX) infusion 8 mg/hr (06/11/13 0150)    Results for orders placed during the hospital encounter of 06/10/13 (from the past 24 hour(s))  TYPE AND SCREEN     Status: None   Collection Time    06/10/13 10:25 PM      Result Value Range   ABO/RH(D) O POS     Antibody Screen NEG     Sample Expiration 06/13/2013     Unit Number [id number]     Blood Component Type RED CELLS,LR     Unit division 00     Status of Unit ISSUED     Transfusion Status OK TO TRANSFUSE     Crossmatch Result Compatible     Unit Number [id number]     Blood Component Type RED CELLS,LR     Unit division 00     Status of Unit ALLOCATED     Transfusion Status OK TO TRANSFUSE     Crossmatch Result Compatible    CBC WITH DIFFERENTIAL     Status: Abnormal  Collection Time    06/10/13 10:33 PM      Result Value Range   WBC 10.5  4.0 - 10.5 K/uL   RBC 3.64 (*) 3.87 - 5.11 MIL/uL   Hemoglobin 10.5 (*) 12.0 - 15.0 g/dL   HCT 81.1 (*) 91.4 - 78.2 %   MCV 90.1  78.0 - 100.0 fL   MCH 28.8  26.0 - 34.0 pg   MCHC 32.0  30.0 - 36.0 g/dL   RDW 95.6  21.3 - 08.6 %   Platelets 127 (*) 150 - 400 K/uL   Neutrophils Relative % 79 (*) 43 - 77 %   Neutro Abs 8.3 (*) 1.7 - 7.7 K/uL   Lymphocytes Relative 16  12 - 46 %   Lymphs Abs 1.7  0.7 - 4.0 K/uL   Monocytes Relative 4  3 - 12 %   Monocytes Absolute 0.5  0.1 - 1.0 K/uL   Eosinophils Relative 0  0 - 5 %   Eosinophils Absolute 0.0  0.0 - 0.7 K/uL   Basophils Relative 0  0 - 1 %   Basophils Absolute 0.0  0.0 - 0.1 K/uL  COMPREHENSIVE METABOLIC PANEL     Status: Abnormal   Collection Time    06/10/13 10:33 PM      Result Value Range   Sodium 141  135 - 145 mEq/L    Potassium 3.0 (*) 3.5 - 5.1 mEq/L   Chloride 103  96 - 112 mEq/L   CO2 26  19 - 32 mEq/L   Glucose, Bld 195 (*) 70 - 99 mg/dL   BUN 19  6 - 23 mg/dL   Creatinine, Ser 5.78  0.50 - 1.10 mg/dL   Calcium 8.6  8.4 - 46.9 mg/dL   Total Protein 6.1  6.0 - 8.3 g/dL   Albumin 2.9 (*) 3.5 - 5.2 g/dL   AST 14  0 - 37 U/L   ALT 11  0 - 35 U/L   Alkaline Phosphatase 101  39 - 117 U/L   Total Bilirubin 0.2 (*) 0.3 - 1.2 mg/dL   GFR calc non Af Amer 72 (*) >90 mL/min   GFR calc Af Amer 83 (*) >90 mL/min  PROTIME-INR     Status: None   Collection Time    06/10/13 10:33 PM      Result Value Range   Prothrombin Time 13.7  11.6 - 15.2 seconds   INR 1.06  0.00 - 1.49  OCCULT BLOOD, POC DEVICE     Status: Abnormal   Collection Time    06/10/13 11:09 PM      Result Value Range   Fecal Occult Bld POSITIVE (*) NEGATIVE  CG4 I-STAT (LACTIC ACID)     Status: Abnormal   Collection Time    06/10/13 11:42 PM      Result Value Range   Lactic Acid, Venous 5.16 (*) 0.5 - 2.2 mmol/L  CBC     Status: Abnormal   Collection Time    06/11/13  4:10 AM      Result Value Range   WBC 8.3  4.0 - 10.5 K/uL   RBC 2.51 (*) 3.87 - 5.11 MIL/uL   Hemoglobin 7.5 (*) 12.0 - 15.0 g/dL   HCT 62.9 (*) 52.8 - 41.3 %   MCV 89.2  78.0 - 100.0 fL   MCH 29.9  26.0 - 34.0 pg   MCHC 33.5  30.0 - 36.0 g/dL   RDW 24.4  01.0 - 27.2 %  Platelets 85 (*) 150 - 400 K/uL  BASIC METABOLIC PANEL     Status: Abnormal   Collection Time    06/11/13  4:10 AM      Result Value Range   Sodium 142  135 - 145 mEq/L   Potassium 3.9  3.5 - 5.1 mEq/L   Chloride 110  96 - 112 mEq/L   CO2 24  19 - 32 mEq/L   Glucose, Bld 132 (*) 70 - 99 mg/dL   BUN 19  6 - 23 mg/dL   Creatinine, Ser 1.61  0.50 - 1.10 mg/dL   Calcium 7.4 (*) 8.4 - 10.5 mg/dL   GFR calc non Af Amer 76 (*) >90 mL/min   GFR calc Af Amer 89 (*) >90 mL/min     No results found.  ROS:  As stated above in the HPI otherwise negative.  Blood pressure 120/52, pulse 72,  temperature 97.6 F (36.4 C), temperature source Oral, resp. rate 18, height 5\' 3"  (1.6 m), weight 92 lb 9.5 oz (42 kg), SpO2 96.00%.    PE: Gen: NAD, Alert and Oriented HEENT:  [place]/AT, EOMI Neck: Supple, no LAD Lungs: CTA Bilaterally CV: Irreg, irreg. ABM: Soft, NTND, +BS Ext: No C/C/E Rectal: Fresh blood, no masses, or ulcerations palpated  Assessment/Plan: 1) Hematochezia. 2) Atrial fibrillation. 3) Orthostasis.   I am presuming the patient has a diverticular bleed.  She has not had any further bloody bowel movements for the past two hours.  No complaints of any abdominal pain to suggest ischemic colitis.  Her lactate level is elevated, but her abdominal examination is benign.  The rectal examination is negative for any masses and no ulcerations.    Plan: 1) Monitor HGB and transfuse as needed. 2) If her bleeding stops or markedly slows down it would not be unreasonable to perform a colonoscopy.  3) If she continues to have brisk bleeding a RBC scan will be ordered. 4) I will sign out to [facility] this AM who will assume her care.  Lloyd Cullinan D 06/11/2013, 5:17 AM

## 2013-06-11 NOTE — Progress Notes (Addendum)
Informed of increased rate of bleeding and bloody BMs, patient remains hemodynamically stable from VS perspective.  Have ordered STAT H/H, transfusion orders for 2 units PRBC put in.  Called and spoke with Dr. Elnoria Howard who is on his way in at this time although he isnt sure that there is much he will be able to do given that this sounds like a diverticular bleed at this point.  Patient seen and re-evaluated: abdomen exam unchanged: non tender, scaphoid, while I can hear cardiac sounds in the upper abdomen feel that this is just because patient is very thin, she dosent really seem to have a pulsatile mass that would be concerning for AAA.  Patient continues to be AAOx3 without distress, denying any pain except for a brief sense of urgency right before the bowel movements.  Spoke with daughter at bedside and relayed our findings and concerns and planned interventions.

## 2013-06-11 NOTE — Progress Notes (Signed)
[facility] Progress Note   Subjective  Feels okay, no bleeding / BMS since 3:30am. She is s/p 1 unit of blood   Objective   Vital signs in last 24 hours: Temp:  [97.4 F (36.3 C)-97.8 F (36.6 C)] 97.7 F (36.5 C) (06/16 0700) Pulse Rate:  [71-118] 79 (06/16 0700) Resp:  [18-20] 20 (06/16 0700) BP: (75-177)/(48-78) 177/59 mmHg (06/16 0700) SpO2:  [95 %-100 %] 100 % (06/16 0700) Weight:  [92 lb 9.5 oz (42 kg)] 92 lb 9.5 oz (42 kg) (06/16 0049) Last BM Date: 06/11/13 General:    Pleasant black female in NAD Heart:  Regular rate, irregular rhythm Lungs: Respirations even and unlabored, lungs CTA bilaterally Abdomen:  Soft, nontender and nondistended. Normal bowel sounds. Loud abdominal bruit. Extremities:  Without edema. Neurologic:  Alert and oriented,  grossly normal neurologically. Psych:  Cooperative. Normal mood and affect.  Lab Results:  Recent Labs  06/10/13 2233 06/11/13 0410  WBC 10.5 8.3  HGB 10.5* 7.5*  HCT 32.8* 22.4*  PLT 127* 85*   BMET  Recent Labs  06/10/13 2233 06/11/13 0410  NA 141 142  K 3.0* 3.9  CL 103 110  CO2 26 24  GLUCOSE 195* 132*  BUN 19 19  CREATININE 0.71 0.58  CALCIUM 8.6 7.4*   LFT  Recent Labs  06/10/13 2233  PROT 6.1  ALBUMIN 2.9*  AST 14  ALT 11  ALKPHOS 101  BILITOT 0.2*   PT/INR  Recent Labs  06/10/13 2233  LABPROT 13.7  INR 1.06     Assessment / Plan:   73. 79 year old female with GI bleed associated with hypotension. Bleeding presumed lower. bleed with normal BUN but of course brisk upper bleed not totally excluded. Suspect diverticular hemorrhage. No bleeding in last 5 hours, she is hemodynamically stable. If patient has recurrent brisk bleeding will likely need bleeding scan. Otherwise it is reasonable to pursue colonoscopy in am. Patient agreeable, in fact patient thought she was getting a colonoscopy today.   2. Anemia of acute blood loss. Hgb down from 10.5 on admission last night to 7.5  today. Her baseline hgb seems to be between 11-13. Patient has received 1 of 2 units of blood thus far this am.   3. History of afib.  4. Acute on chronic thrombocytopenia. Baseline platelet count 150-200, now down to 85 and likely due in part to bleeding. Cause of chronic low platelet count not clear at this point.     LOS: 1 day   Willette Cluster  06/11/2013, 8:02 AM    Attending physician's note   I have taken an interval history, reviewed the chart and examined the patient. I agree with the Advanced Practitioner's note, impression and recommendations. Colonoscopy tomorrow. Monitor CBC.  Venita Lick. Russella Dar, MD [facility]

## 2013-06-11 NOTE — Progress Notes (Signed)
[facility] PROGRESS NOTE  Cynthia Graham AVW:[id number] DOB: Feb 17, 1917 DOA: 06/10/2013 PCP: Rogelia Boga, MD  Assessment/Plan: 1. GI bleed - At this point GI is on board - patient has been transfused 2 units of PRBC - post transfusion h/h improved at 11.4 - Plan is for further work up by GI with colonoscopy likely next am 06/12/13  2. Anemia - 2ary to # 1 - At this point no active bleeding - should patient have any recurring bleeding we should obtain a bleeding scan  3. Hypotension  - at this point resolved and likely was due to acute blood loss. - resolved with transfusion or PRBC's - currently patient hypertensive and her amlodipine, lotensin, and metoprolol currently on board.  4. HTN - As listed above. Last two blood pressures have ranged from 177/59-155/66  5. Atrial fibrillation - rate controlled with B blocker. - patient on aspirin at home.  Agree with holding given # 1 and 2  Code Status: DNR Family Communication: discussed with patient and daughter at bedside.  Disposition Plan:    Consultants:  GI: Dr. Elnoria Howard  Procedures:  none  Antibiotics:  None  HPI/Subjective: Patient has no new complaints. Denies any active bleeding  Objective: Filed Vitals:   06/11/13 0855 06/11/13 0955 06/11/13 1055 06/11/13 1125  BP: 143/66 169/69 139/101 163/69  Pulse: 79 85 65 63  Temp: 97.7 F (36.5 C) 98.6 F (37 C) 98.6 F (37 C) 98.4 F (36.9 C)  TempSrc: Oral Oral Oral Oral  Resp: 18 20 20 18   Height:      Weight:      SpO2:        Intake/Output Summary (Last 24 hours) at 06/11/13 1307 Last data filed at 06/11/13 1125  Gross per 24 hour  Intake 1744.5 ml  Output    400 ml  Net 1344.5 ml   Filed Weights   06/11/13 0049  Weight: 42 kg (92 lb 9.5 oz)    Exam:   General:  Pt is in NAD, Alert and Awake  Cardiovascular: Irregularly irregular, no murmurs  Respiratory: CTA BL, no wheezes  Abdomen: soft, ND  Musculoskeletal: no  cyanosis or clubbing    Data Reviewed: Basic Metabolic Panel:  Recent Labs Lab 06/10/13 2233 06/11/13 0410  NA 141 142  K 3.0* 3.9  CL 103 110  CO2 26 24  GLUCOSE 195* 132*  BUN 19 19  CREATININE 0.71 0.58  CALCIUM 8.6 7.4*   Liver Function Tests:  Recent Labs Lab 06/10/13 2233  AST 14  ALT 11  ALKPHOS 101  BILITOT 0.2*  PROT 6.1  ALBUMIN 2.9*   No results found for this basename: LIPASE, AMYLASE,  in the last 168 hours No results found for this basename: AMMONIA,  in the last 168 hours CBC:  Recent Labs Lab 06/10/13 2233 06/11/13 0410  WBC 10.5 8.3  NEUTROABS 8.3*  --   HGB 10.5* 7.5*  HCT 32.8* 22.4*  MCV 90.1 89.2  PLT 127* 85*   Cardiac Enzymes: No results found for this basename: CKTOTAL, CKMB, CKMBINDEX, TROPONINI,  in the last 168 hours BNP (last 3 results)  Recent Labs  10/17/12 1200  PROBNP 791.0*   CBG: No results found for this basename: GLUCAP,  in the last 168 hours  No results found for this or any previous visit (from the past 240 hour(s)).   Studies: No results found.  Scheduled Meds: . sodium chloride   Intravenous STAT  . amLODipine  5 mg  Oral q morning - 10a  . benazepril  40 mg Oral q morning - 10a  . metoprolol  100 mg Oral BID  . multivitamin with minerals  1 tablet Oral Daily  . [START ON 06/14/2013] pantoprazole (PROTONIX) IV  40 mg Intravenous Q12H  . peg 3350 powder  0.5 kit Oral BID  . sodium chloride  3 mL Intravenous Q12H   Continuous Infusions: . sodium chloride 1,000 mL (06/11/13 1222)  . pantoprozole (PROTONIX) infusion 8 mg/hr (06/11/13 1220)    Principal Problem:   GI bleed Active Problems:   HYPERTENSION   Atrial fibrillation and flutter   Acute blood loss anemia    Time spent: > 35 minutes    Penny Pia  [facility] Pager [phone]. If 7PM-7AM, please contact night-coverage at [email or web address], password [facility] 06/11/2013, 1:07 PM  LOS: 1 day

## 2013-06-12 ENCOUNTER — Encounter (HOSPITAL_COMMUNITY)
Admission: EM | Disposition: A | Discharge status: Home / Self Care | Payer: Self-pay | Source: Home / Self Care | Attending: Family Medicine

## 2013-06-12 ENCOUNTER — Encounter (HOSPITAL_COMMUNITY): Payer: Self-pay

## 2013-06-12 DIAGNOSIS — K625 Hemorrhage of anus and rectum: Secondary | ICD-10-CM

## 2013-06-12 DIAGNOSIS — K921 Melena: Secondary | ICD-10-CM | POA: Diagnosis present

## 2013-06-12 DIAGNOSIS — D62 Acute posthemorrhagic anemia: Secondary | ICD-10-CM

## 2013-06-12 DIAGNOSIS — I4891 Unspecified atrial fibrillation: Secondary | ICD-10-CM

## 2013-06-12 DIAGNOSIS — K922 Gastrointestinal hemorrhage, unspecified: Secondary | ICD-10-CM

## 2013-06-12 DIAGNOSIS — I10 Essential (primary) hypertension: Secondary | ICD-10-CM

## 2013-06-12 HISTORY — PX: COLONOSCOPY: SHX5424

## 2013-06-12 LAB — TYPE AND SCREEN: Unit division: 0

## 2013-06-12 LAB — HEMOGLOBIN: Hemoglobin: 13.5 g/dL (ref 12.0–15.0)

## 2013-06-12 SURGERY — COLONOSCOPY
Anesthesia: Moderate Sedation

## 2013-06-12 MED ORDER — FUROSEMIDE 40 MG PO TABS: 40.0000 mg | ORAL_TABLET | Freq: Every day | ORAL | Status: DC

## 2013-06-12 MED ORDER — MIDAZOLAM HCL 5 MG/5ML IJ SOLN
INTRAMUSCULAR | Status: DC | PRN
  Administered 2013-06-12 (×2): 2 mg via INTRAVENOUS

## 2013-06-12 MED ORDER — FENTANYL CITRATE 0.05 MG/ML IJ SOLN
INTRAMUSCULAR | Status: AC
  Filled 2013-06-12: qty 2

## 2013-06-12 MED ORDER — KCL IN DEXTROSE-NACL 20-5-0.45 MEQ/L-%-% IV SOLN
INTRAVENOUS | Status: DC
  Administered 2013-06-12: 13:00:00 via INTRAVENOUS
  Filled 2013-06-12 (×2): qty 1000

## 2013-06-12 MED ORDER — HYDRALAZINE HCL 20 MG/ML IJ SOLN
5.0000 mg | INTRAMUSCULAR | Status: DC | PRN
  Administered 2013-06-12: 5 mg via INTRAVENOUS
  Filled 2013-06-12: qty 1

## 2013-06-12 MED ORDER — MIDAZOLAM HCL 10 MG/2ML IJ SOLN
INTRAMUSCULAR | Status: AC
  Filled 2013-06-12: qty 2

## 2013-06-12 MED ORDER — FENTANYL CITRATE 0.05 MG/ML IJ SOLN
INTRAMUSCULAR | Status: DC | PRN
  Administered 2013-06-12 (×2): 25 ug via INTRAVENOUS

## 2013-06-12 NOTE — Progress Notes (Signed)
[facility] PROGRESS NOTE  Cynthia Graham QIO:[id number] DOB: 03-30-1917 DOA: 06/10/2013 PCP: Rogelia Boga, MD Brief Narrative: 79 y/o that presented with painless GI bleed subsequently developed anemia and hypotension.  Resolved after administration of PRBC x 2. GI involved and patient placed on protonix gtt with plans for colonoscopy 6/17.    Assessment/Plan: 1. GI bleed - At this point GI is on board - patient has been transfused 2 units of PRBC - post transfusion h/h improved at 11.4 - Colonoscopy results pending as well as further GI recommendations.  2. Anemia - 2ary to # 1 - At this point no active bleeding - should patient have any recurring bleeding we should obtain a bleeding scan (none while on the floor)  3. Hypotension  - at this point resolved and likely was due to acute blood loss. - resolved with transfusion or PRBC's - currently patient hypertensive and her amlodipine, lotensin, and metoprolol currently on board.  4. HTN - As listed above. Last two blood pressures have ranged from 153/89-194/104 - Patient currently in colonoscopy suite once patient returns if BP still elevated will plan on placing order for PRN hypertensive medication.  5. Atrial fibrillation - rate controlled with B blocker. - patient on aspirin at home.  Agree with holding given # 1 and 2  Code Status: DNR Family Communication: discussed with patient and daughter at bedside.  Disposition Plan:    Consultants:  GI: Dr. Elnoria Howard  Procedures:  none  Antibiotics:  None  HPI/Subjective: Patient has no new complaints. Had to prep for her colonoscopy and she reports that when she had stool output she noticed some darker blood but no bright red blood.  Otherwise no new reports of BRBPR.  Objective: Filed Vitals:   06/12/13 1045 06/12/13 1059 06/12/13 1110 06/12/13 1115  BP: 169/89 173/101 173/101 194/104  Pulse:      Temp:      TempSrc:      Resp: 25 25 23 23   Height:       Weight:      SpO2:        Intake/Output Summary (Last 24 hours) at 06/12/13 1130 Last data filed at 06/12/13 0900  Gross per 24 hour  Intake 4072.08 ml  Output    600 ml  Net 3472.08 ml   Filed Weights   06/11/13 0049  Weight: 42 kg (92 lb 9.5 oz)    Exam:   General:  Pt is in NAD, Alert and Awake  Cardiovascular: Irregularly irregular, no murmurs  Respiratory: CTA BL, no wheezes  Abdomen: soft, ND  Musculoskeletal: no cyanosis or clubbing    Data Reviewed: Basic Metabolic Panel:  Recent Labs Lab 06/10/13 2233 06/11/13 0410  NA 141 142  K 3.0* 3.9  CL 103 110  CO2 26 24  GLUCOSE 195* 132*  BUN 19 19  CREATININE 0.71 0.58  CALCIUM 8.6 7.4*   Liver Function Tests:  Recent Labs Lab 06/10/13 2233  AST 14  ALT 11  ALKPHOS 101  BILITOT 0.2*  PROT 6.1  ALBUMIN 2.9*   No results found for this basename: LIPASE, AMYLASE,  in the last 168 hours No results found for this basename: AMMONIA,  in the last 168 hours CBC:  Recent Labs Lab 06/10/13 2233 06/11/13 0410 06/11/13 1332  WBC 10.5 8.3  --   NEUTROABS 8.3*  --   --   HGB 10.5* 7.5* 11.4*  HCT 32.8* 22.4* 32.5*  MCV 90.1 89.2  --  PLT 127* 85*  --    Cardiac Enzymes: No results found for this basename: CKTOTAL, CKMB, CKMBINDEX, TROPONINI,  in the last 168 hours BNP (last 3 results)  Recent Labs  10/17/12 1200  PROBNP 791.0*   CBG: No results found for this basename: GLUCAP,  in the last 168 hours  No results found for this or any previous visit (from the past 240 hour(s)).   Studies: No results found.  Scheduled Meds: . amLODipine  5 mg Oral q morning - 10a  . benazepril  40 mg Oral q morning - 10a  . feeding supplement  1 Container Oral BID BM  . metoprolol  100 mg Oral BID  . multivitamin with minerals  1 tablet Oral Daily  . [START ON 06/14/2013] pantoprazole (PROTONIX) IV  40 mg Intravenous Q12H  . protein supplement  1 scoop Oral TID WC  . sodium chloride  3 mL  Intravenous Q12H   Continuous Infusions: . dextrose 5 % and 0.45 % NaCl with KCl 20 mEq/L    . pantoprozole (PROTONIX) infusion 8 mg/hr (06/11/13 2239)    Principal Problem:   GI bleed Active Problems:   HYPERTENSION   Atrial fibrillation and flutter   Acute blood loss anemia   Hypotension, unspecified   Blood in stool    Time spent: > 35 minutes    Penny Pia  [facility] Pager [phone]. If 7PM-7AM, please contact night-coverage at [email or web address], password [facility] 06/12/2013, 11:30 AM  LOS: 2 days

## 2013-06-12 NOTE — Op Note (Signed)
[facility] [address] [place], [address]   COLONOSCOPY PROCEDURE REPORT  PATIENT: Cynthia, Graham  MR#: [id number] BIRTHDATE: 06/01/1917 , 89  yrs. old GENDER: Female ENDOSCOPIST: Meryl Dare, MD, [facility] REFERRED [id number] PROCEDURE DATE:  06/12/2013 PROCEDURE:   Colonoscopy, diagnostic ASA CLASS:   Class III INDICATIONS:hematochezia. MEDICATIONS: medications were titrated to patient response per physician's verbal order, Fentanyl 50 mcg IV, and Versed 4 mg IV DESCRIPTION OF PROCEDURE:   After the risks benefits and alternatives of the procedure were thoroughly explained, informed consent was obtained.  A digital rectal exam revealed no abnormalities of the rectum.   The Pentax Ped Colon [id number] endoscope was introduced through the anus and advanced to the cecum, which was identified by both the appendix and ileocecal valve. No adverse events experienced.   The quality of the prep was adequate, using MoviPrep  The instrument was then slowly withdrawn as the colon was fully examined.  COLON FINDINGS: Severe diverticulosis was noted in the ascending colon, at the hepatic flexure, in the transverse colon, at the splenic flexure, in the descending colon, and sigmoid colon.   The colon was otherwise normal.  There was no diverticulosis, inflammation, polyps or cancers unless previously stated. Retroflexion was not performed due to a narrow rectal vault. The time to cecum=6 minutes 00 seconds.  Withdrawal time=12 minutes 00 seconds.  The scope was withdrawn and the procedure completed.  COMPLICATIONS: There were no complications.  ENDOSCOPIC IMPRESSION: 1.   Severe diverticulosis in the ascending colon, hepatic flexure, transverse colon, splenic flexure, descending colon, and sigmoid colon 2.   The colon was otherwise normal  RECOMMENDATIONS: 1.  High fiber diet with liberal fluid intake to start in 2 weeks, low residue diet for first 2  week 2.  Hold aspirin, aspirin products, and anti-inflammatory medication for 2 weeks.  eSigned:  Meryl Dare, MD, [facility] 06/12/2013 10:58 AM

## 2013-06-12 NOTE — H&P (View-Only) (Signed)
Consult for [facility]  Reason for Consult: Hematochezia Referring Physician: [facility]  Cynthia Graham HPI: This is a 79 year old female with a distant history of hematochezia who presents with painless bleeding.  Her bleeding started acutely at 4 PM yesterday and she continued to have blood episodes at home.  She subsequently presented to the ER and she was noted to be orthostatic.  Her BP dropped from 120 to 70 mmHg.  No complaints of chest pain or vomiting, but she does have some mild nausea.  The patient reports having several colonoscopies in the past and the last time she had a colonoscopy was years ago.  She cannot recall the findings.  There is a history of mild constipation.  No reports of NSAID use and there is no history of abdominal aortic aneurysms.    Past Medical History  Diagnosis Date  . ANXIETY 05/17/2008  . ATRIAL FIBRILLATION WITH RAPID VENTRICULAR RESPONSE 10/09/2009  . CALLUS, RIGHT FOOT 02/11/2009  . EPISTAXIS, RECURRENT 01/15/2010  . GAIT IMBALANCE 08/16/2008  . GERD 08/08/2007  . HYPERLIPIDEMIA 05/17/2008  . HYPERTENSION 08/08/2007  . OSTEOARTHRITIS 05/17/2008  . SOB 08/18/2007  . WEAKNESS 05/17/2008  . DJD (degenerative joint disease)   . History of hiatal hernia   . Stroke     Past Surgical History  Procedure Laterality Date  . Abdominal hysterectomy    . Esophagogastroduodenoscopy      stricture, hiatal hernia ,reflux esopg.  . Right hip fracture  October 2009    Dr Olin    Family History  Problem Relation Age of Onset  . Hypertension Neg Hx     family    Social History:  reports that she quit smoking about 44 years ago. Her smoking use included Cigarettes. She smoked 0.00 packs per day. She has never used smokeless tobacco. She reports that she does not drink alcohol or use illicit drugs.  Allergies: No Known Allergies  Medications:  Scheduled: . sodium chloride   Intravenous STAT  . amLODipine  5 mg Oral q morning - 10a  . benazepril  40 mg  Oral q morning - 10a  . metoprolol  100 mg Oral BID  . multivitamin with minerals  1 tablet Oral Daily  . [START ON 06/14/2013] pantoprazole (PROTONIX) IV  40 mg Intravenous Q12H  . sodium chloride  3 mL Intravenous Q12H   Continuous: . sodium chloride 1,000 mL (06/11/13 0228)  . pantoprozole (PROTONIX) infusion 8 mg/hr (06/11/13 0150)    Results for orders placed during the hospital encounter of 06/10/13 (from the past 24 hour(s))  TYPE AND SCREEN     Status: None   Collection Time    06/10/13 10:25 PM      Result Value Range   ABO/RH(D) O POS     Antibody Screen NEG     Sample Expiration 06/13/2013     Unit Number [id number]     Blood Component Type RED CELLS,LR     Unit division 00     Status of Unit ISSUED     Transfusion Status OK TO TRANSFUSE     Crossmatch Result Compatible     Unit Number [id number]     Blood Component Type RED CELLS,LR     Unit division 00     Status of Unit ALLOCATED     Transfusion Status OK TO TRANSFUSE     Crossmatch Result Compatible    CBC WITH DIFFERENTIAL     Status: Abnormal     Collection Time    06/10/13 10:33 PM      Result Value Range   WBC 10.5  4.0 - 10.5 K/uL   RBC 3.64 (*) 3.87 - 5.11 MIL/uL   Hemoglobin 10.5 (*) 12.0 - 15.0 g/dL   HCT 32.8 (*) 36.0 - 46.0 %   MCV 90.1  78.0 - 100.0 fL   MCH 28.8  26.0 - 34.0 pg   MCHC 32.0  30.0 - 36.0 g/dL   RDW 14.7  11.5 - 15.5 %   Platelets 127 (*) 150 - 400 K/uL   Neutrophils Relative % 79 (*) 43 - 77 %   Neutro Abs 8.3 (*) 1.7 - 7.7 K/uL   Lymphocytes Relative 16  12 - 46 %   Lymphs Abs 1.7  0.7 - 4.0 K/uL   Monocytes Relative 4  3 - 12 %   Monocytes Absolute 0.5  0.1 - 1.0 K/uL   Eosinophils Relative 0  0 - 5 %   Eosinophils Absolute 0.0  0.0 - 0.7 K/uL   Basophils Relative 0  0 - 1 %   Basophils Absolute 0.0  0.0 - 0.1 K/uL  COMPREHENSIVE METABOLIC PANEL     Status: Abnormal   Collection Time    06/10/13 10:33 PM      Result Value Range   Sodium 141  135 - 145 mEq/L    Potassium 3.0 (*) 3.5 - 5.1 mEq/L   Chloride 103  96 - 112 mEq/L   CO2 26  19 - 32 mEq/L   Glucose, Bld 195 (*) 70 - 99 mg/dL   BUN 19  6 - 23 mg/dL   Creatinine, Ser 0.71  0.50 - 1.10 mg/dL   Calcium 8.6  8.4 - 10.5 mg/dL   Total Protein 6.1  6.0 - 8.3 g/dL   Albumin 2.9 (*) 3.5 - 5.2 g/dL   AST 14  0 - 37 U/L   ALT 11  0 - 35 U/L   Alkaline Phosphatase 101  39 - 117 U/L   Total Bilirubin 0.2 (*) 0.3 - 1.2 mg/dL   GFR calc non Af Amer 72 (*) >90 mL/min   GFR calc Af Amer 83 (*) >90 mL/min  PROTIME-INR     Status: None   Collection Time    06/10/13 10:33 PM      Result Value Range   Prothrombin Time 13.7  11.6 - 15.2 seconds   INR 1.06  0.00 - 1.49  OCCULT BLOOD, POC DEVICE     Status: Abnormal   Collection Time    06/10/13 11:09 PM      Result Value Range   Fecal Occult Bld POSITIVE (*) NEGATIVE  CG4 I-STAT (LACTIC ACID)     Status: Abnormal   Collection Time    06/10/13 11:42 PM      Result Value Range   Lactic Acid, Venous 5.16 (*) 0.5 - 2.2 mmol/L  CBC     Status: Abnormal   Collection Time    06/11/13  4:10 AM      Result Value Range   WBC 8.3  4.0 - 10.5 K/uL   RBC 2.51 (*) 3.87 - 5.11 MIL/uL   Hemoglobin 7.5 (*) 12.0 - 15.0 g/dL   HCT 22.4 (*) 36.0 - 46.0 %   MCV 89.2  78.0 - 100.0 fL   MCH 29.9  26.0 - 34.0 pg   MCHC 33.5  30.0 - 36.0 g/dL   RDW 14.8  11.5 - 15.5 %     Platelets 85 (*) 150 - 400 K/uL  BASIC METABOLIC PANEL     Status: Abnormal   Collection Time    06/11/13  4:10 AM      Result Value Range   Sodium 142  135 - 145 mEq/L   Potassium 3.9  3.5 - 5.1 mEq/L   Chloride 110  96 - 112 mEq/L   CO2 24  19 - 32 mEq/L   Glucose, Bld 132 (*) 70 - 99 mg/dL   BUN 19  6 - 23 mg/dL   Creatinine, Ser 0.58  0.50 - 1.10 mg/dL   Calcium 7.4 (*) 8.4 - 10.5 mg/dL   GFR calc non Af Amer 76 (*) >90 mL/min   GFR calc Af Amer 89 (*) >90 mL/min     No results found.  ROS:  As stated above in the HPI otherwise negative.  Blood pressure 120/52, pulse 72,  temperature 97.6 F (36.4 C), temperature source Oral, resp. rate 18, height 5' 3" (1.6 m), weight 92 lb 9.5 oz (42 kg), SpO2 96.00%.    PE: Gen: NAD, Alert and Oriented HEENT:  [place]/AT, EOMI Neck: Supple, no LAD Lungs: CTA Bilaterally CV: Irreg, irreg. ABM: Soft, NTND, +BS Ext: No C/C/E Rectal: Fresh blood, no masses, or ulcerations palpated  Assessment/Plan: 1) Hematochezia. 2) Atrial fibrillation. 3) Orthostasis.   I am presuming the patient has a diverticular bleed.  She has not had any further bloody bowel movements for the past two hours.  No complaints of any abdominal pain to suggest ischemic colitis.  Her lactate level is elevated, but her abdominal examination is benign.  The rectal examination is negative for any masses and no ulcerations.    Plan: 1) Monitor HGB and transfuse as needed. 2) If her bleeding stops or markedly slows down it would not be unreasonable to perform a colonoscopy.  3) If she continues to have brisk bleeding a RBC scan will be ordered. 4) I will sign out to [facility] this AM who will assume her care.  Armonii Sieh D 06/11/2013, 5:17 AM      

## 2013-06-12 NOTE — Progress Notes (Signed)
Patient's blood pressure was high at bedtime and after giving scheduled Lopressor.  Notified NP on call who placed order for Hydralazine prn.  Gave dose at 04:37 am.  Will continue to monitor patient.

## 2013-06-12 NOTE — Discharge Summary (Signed)
Physician Discharge Summary  Cynthia Graham:[id number] DOB: 06/06/1917 DOA: 06/10/2013  PCP: Rogelia Boga, MD  Admit date: 06/10/2013 Discharge date: 06/12/2013  Time spent: > 35 minutes  Recommendations for Outpatient Follow-up:  1. Please be sure to follow up with your primary care physician in 1-2 weeks or sooner 2. Also be sure to follow up with blood pressures and adjust medication pending blood pressure values.   Discharge Diagnoses:  Principal Problem:   GI bleed Active Problems:   HYPERTENSION   Atrial fibrillation and flutter   Acute blood loss anemia   Hypotension, unspecified   Blood in stool   Discharge Condition: Stable  Diet recommendation: Low sodium/heart healthy  Filed Weights   06/11/13 0049  Weight: 42 kg (92 lb 9.5 oz)    History of present illness:  79 y/o with atrial fibrillation, HTN, HPL that presented to the ED with painless GI bleeding.  Hospital Course:  1. GI bleed  - At this point GI is on board  - patient has been transfused 2 units of PRBC  - post transfusion h/h improved at 11.4  - GI has evaluated with colonoscopy and recommended the following: ENDOSCOPIC IMPRESSION:  1. Severe diverticulosis in the ascending colon, hepatic flexure,  transverse colon, splenic flexure, descending colon, and sigmoid  colon  2. The colon was otherwise normal  RECOMMENDATIONS:  1. High fiber diet with liberal fluid intake to start in 2 weeks,  low residue diet for first 2 week  2. Hold aspirin, aspirin products, and anti-inflammatory medication  for 2 weeks.    2. Anemia  - 2ary to # 1  - At this point no active bleeding  - patient to follow up with primary care physician at home.  3. Hypotension  - at this point resolved   4. HTN  - Last two blood pressures have ranged from 153/89-194/104. We have continued patient's home regimen and patient is to continue her home regimen and follow up with her pcp for further adjustments.   5.  Atrial fibrillation  - rate controlled with B blocker.  - patient on aspirin at home. Agree with holding given # 1 and 2   Procedures:  Colonoscopy  Consultations:  GI: Dr. Russella Dar  Discharge Exam: Filed Vitals:   06/12/13 1110 06/12/13 1115 06/12/13 1230 06/12/13 1501  BP: 173/101 194/104 152/79 150/68  Pulse:   97 65  Temp:   97.9 F (36.6 C) 97.4 F (36.3 C)  TempSrc:   Oral Oral  Resp: 23 23 20 20   Height:      Weight:      SpO2:    100%    General: Pt in NAD, Alert and Awake Cardiovascular: irregularly irregular, no mrg Respiratory: CTA BL, no wheezes.  Discharge Instructions  Discharge Orders   Future Appointments Provider Department Dept Phone   10/01/2013 10:30 AM Gordy Savers, MD [facility] [phone]   Future Orders Complete By Expires     Call MD for:  difficulty breathing, headache or visual disturbances  As directed     Call MD for:  severe uncontrolled pain  As directed     Call MD for:  temperature >100.4  As directed     Diet - low sodium heart healthy  As directed     Discharge instructions  As directed     Comments:      Please be sure to follow up with your primary care physician in 1-2 weeks or  sooner should any new concerns.    Increase activity slowly  As directed         Medication List    STOP taking these medications       aspirin EC 81 MG tablet      TAKE these medications       albuterol 108 (90 BASE) MCG/ACT inhaler  Commonly known as:  PROVENTIL HFA;VENTOLIN HFA  Inhale 2 puffs into the lungs every 6 (six) hours as needed. wheezing     amLODipine 5 MG tablet  Commonly known as:  NORVASC  Take 5 mg by mouth every morning.     benazepril 40 MG tablet  Commonly known as:  LOTENSIN  Take 40 mg by mouth every morning.     furosemide 40 MG tablet  Commonly known as:  LASIX  Take 1 tablet (40 mg total) by mouth daily.  Start taking on:  06/14/2013     lansoprazole 30 MG capsule  Commonly known  as:  PREVACID  Take 30 mg by mouth daily as needed. heartburn     LORazepam 0.5 MG tablet  Commonly known as:  ATIVAN  Take 0.5 mg by mouth every 8 (eight) hours as needed for anxiety.     metoprolol 100 MG tablet  Commonly known as:  LOPRESSOR  Take 100 mg by mouth 2 (two) times daily.     multivitamin with minerals Tabs  Take 1 tablet by mouth daily.     promethazine 12.5 MG tablet  Commonly known as:  PHENERGAN  Take 12.5 mg by mouth every 8 (eight) hours as needed. nausea       No Known Allergies    The results of significant diagnostics from this hospitalization (including imaging, microbiology, ancillary and laboratory) are listed below for reference.    Significant Diagnostic Studies: No results found.  Microbiology: No results found for this or any previous visit (from the past 240 hour(s)).   Labs: Basic Metabolic Panel:  Recent Labs Lab 06/10/13 2233 06/11/13 0410  NA 141 142  K 3.0* 3.9  CL 103 110  CO2 26 24  GLUCOSE 195* 132*  BUN 19 19  CREATININE 0.71 0.58  CALCIUM 8.6 7.4*   Liver Function Tests:  Recent Labs Lab 06/10/13 2233  AST 14  ALT 11  ALKPHOS 101  BILITOT 0.2*  PROT 6.1  ALBUMIN 2.9*   No results found for this basename: LIPASE, AMYLASE,  in the last 168 hours No results found for this basename: AMMONIA,  in the last 168 hours CBC:  Recent Labs Lab 06/10/13 2233 06/11/13 0410 06/11/13 1332 06/12/13 1300  WBC 10.5 8.3  --   --   NEUTROABS 8.3*  --   --   --   HGB 10.5* 7.5* 11.4* 13.5  HCT 32.8* 22.4* 32.5*  --   MCV 90.1 89.2  --   --   PLT 127* 85*  --   --    Cardiac Enzymes: No results found for this basename: CKTOTAL, CKMB, CKMBINDEX, TROPONINI,  in the last 168 hours BNP: BNP (last 3 results)  Recent Labs  10/17/12 1200  PROBNP 791.0*   CBG: No results found for this basename: GLUCAP,  in the last 168 hours     Signed:  Penny Pia  [facility] 06/12/2013, 4:09 PM

## 2013-06-12 NOTE — Interval H&P Note (Signed)
History and Physical Interval Note:  06/12/2013 10:13 AM  Cynthia Graham  has presented today for surgery, with the diagnosis of hematochezia  The various methods of treatment have been discussed with the patient and family. After consideration of risks, benefits and other options for treatment, the patient has consented to  Procedure(s): COLONOSCOPY (N/A) as a surgical intervention .  The patient's history has been reviewed, patient examined, no change in status, stable for surgery.  I have reviewed the patient's chart and labs.  Questions were answered to the patient's satisfaction.     Venita Lick. Russella Dar MD

## 2013-06-12 NOTE — Progress Notes (Signed)
Pt had 1.86 second pause. MD made aware and will continue to monitor. Julio Sicks RN

## 2013-06-13 ENCOUNTER — Encounter (HOSPITAL_COMMUNITY): Payer: Self-pay | Admitting: Gastroenterology

## 2013-06-14 ENCOUNTER — Telehealth: Payer: Self-pay | Admitting: *Deleted

## 2013-06-14 NOTE — Telephone Encounter (Signed)
Transitional care  admit date:06/10/2013 Discharge date: 06/12/2013  Recommendations for outpatient follow-up:  1. Please be sure to follow up with primary care physician in 1-2 weeks or sooner.  2. Also be sure to follow up with blood pressures and adjust medication pending blood pressure values.  Discharge diagnoses: Principal problem:  Gi Bleed Active problems:  HTN  atrial fibrillation and flutter  acute blood loss anemia  hypotension, unspecified  blood in stool  Discharge condition: stable Had colonoscopy while in hospital  Talked with daughter and she states mom is doing good with some weakness.  Her asa was discontinued and that was the on ly medication change while in hospital. She is compliant with her remaining medication.  Appetite is fair good, and it was reccommended she go on a low sodium heart healthy.  She is ambulatory in the hospital and her daughter is helping with her care.  Patient has follow up appointment with dr Amador Cunas on 06/19/2013 at 10am

## 2013-06-19 ENCOUNTER — Encounter: Payer: Self-pay | Admitting: Internal Medicine

## 2013-06-19 ENCOUNTER — Ambulatory Visit (INDEPENDENT_AMBULATORY_CARE_PROVIDER_SITE_OTHER): Payer: Medicare Other | Admitting: Internal Medicine

## 2013-06-19 VITALS — BP 120/70 | HR 62 | Temp 98.2°F | Resp 18 | Wt 91.0 lb

## 2013-06-19 DIAGNOSIS — D62 Acute posthemorrhagic anemia: Secondary | ICD-10-CM

## 2013-06-19 DIAGNOSIS — I10 Essential (primary) hypertension: Secondary | ICD-10-CM

## 2013-06-19 DIAGNOSIS — K922 Gastrointestinal hemorrhage, unspecified: Secondary | ICD-10-CM

## 2013-06-19 DIAGNOSIS — I4891 Unspecified atrial fibrillation: Secondary | ICD-10-CM

## 2013-06-19 DIAGNOSIS — K921 Melena: Secondary | ICD-10-CM

## 2013-06-19 LAB — CBC WITH DIFFERENTIAL/PLATELET
Basophils Absolute: 0 10*3/uL (ref 0.0–0.1)
Basophils Relative: 0.1 % (ref 0.0–3.0)
Hemoglobin: 11.6 g/dL — ABNORMAL LOW (ref 12.0–15.0)
Lymphocytes Relative: 17 % (ref 12.0–46.0)
Monocytes Relative: 8.2 % (ref 3.0–12.0)
Neutro Abs: 6.4 10*3/uL (ref 1.4–7.7)
RBC: 3.73 Mil/uL — ABNORMAL LOW (ref 3.87–5.11)
WBC: 8.7 10*3/uL (ref 4.5–10.5)

## 2013-06-19 NOTE — Progress Notes (Signed)
Subjective:    Patient ID: Cynthia Graham, female    DOB: 02-Nov-1917, 79 y.o.   MRN: [id number]  HPI   79 year old patient who is seen today post hospital discharge. She was noted with lower GI bleeding and acute blood loss anemia. She required transfusions of 2 units of packed RBCs. She had a colonoscopy performed that revealed extensive diverticular disease.  She has a history of chronic atrial fibrillation and iron was discontinued during the hospital. It was recommended to hold for 2 weeks. Her cardiac status has been stable. She has a history of hypertension which has been stable. No new concerns or complaints. She feels that her status is back to baseline.  Hospital records reviewed  Past Medical History  Diagnosis Date  . ANXIETY 05/17/2008  . ATRIAL FIBRILLATION WITH RAPID VENTRICULAR RESPONSE 10/09/2009  . CALLUS, RIGHT FOOT 02/11/2009  . EPISTAXIS, RECURRENT 01/15/2010  . GAIT IMBALANCE 08/16/2008  . GERD 08/08/2007  . HYPERLIPIDEMIA 05/17/2008  . HYPERTENSION 08/08/2007  . OSTEOARTHRITIS 05/17/2008  . SOB 08/18/2007  . WEAKNESS 05/17/2008  . DJD (degenerative joint disease)   . History of hiatal hernia   . Stroke     History   Social History  . Marital Status: Married    Spouse Name: N/A    Number of Children: N/A  . Years of Education: N/A   Occupational History  . Not on file.   Social History Main Topics  . Smoking status: Former Smoker    Types: Cigarettes    Quit date: 12/27/1968  . Smokeless tobacco: Never Used  . Alcohol Use: No  . Drug Use: No  . Sexually Active: No   Other Topics Concern  . Not on file   Social History Narrative  . No narrative on file    Past Surgical History  Procedure Laterality Date  . Abdominal hysterectomy    . Esophagogastroduodenoscopy      stricture, hiatal hernia ,reflux esopg.  . Right hip fracture  October 2009    Dr Charlann Boxer  . Colonoscopy N/A 06/12/2013    Procedure: COLONOSCOPY;  Surgeon: Meryl Dare, MD;   Location: [facility];  Service: Endoscopy;  Laterality: N/A;    Family History  Problem Relation Age of Onset  . Hypertension Neg Hx     family    No Known Allergies  Current Outpatient Prescriptions on File Prior to Visit  Medication Sig Dispense Refill  . albuterol (PROVENTIL HFA;VENTOLIN HFA) 108 (90 BASE) MCG/ACT inhaler Inhale 2 puffs into the lungs every 6 (six) hours as needed. wheezing      . amLODipine (NORVASC) 5 MG tablet Take 5 mg by mouth every morning.      . benazepril (LOTENSIN) 40 MG tablet Take 40 mg by mouth every morning.      . furosemide (LASIX) 40 MG tablet Take 1 tablet (40 mg total) by mouth daily.  30 tablet  0  . lansoprazole (PREVACID) 30 MG capsule Take 30 mg by mouth daily as needed. heartburn      . LORazepam (ATIVAN) 0.5 MG tablet Take 0.5 mg by mouth every 8 (eight) hours as needed for anxiety.      . metoprolol (LOPRESSOR) 100 MG tablet Take 100 mg by mouth 2 (two) times daily.      . Multiple Vitamin (MULTIVITAMIN WITH MINERALS) TABS Take 1 tablet by mouth daily.      . promethazine (PHENERGAN) 12.5 MG tablet Take 12.5 mg by mouth every 8 (eight) hours  as needed. nausea       No current facility-administered medications on file prior to visit.    BP 120/70  Pulse 62  Temp(Src) 98.2 F (36.8 C) (Oral)  Resp 18  Wt 91 lb (41.277 kg)  BMI 16.12 kg/m2  SpO2 95%       Review of Systems  Constitutional: Negative.   HENT: Negative for hearing loss, congestion, sore throat, rhinorrhea, dental problem, sinus pressure and tinnitus.   Eyes: Negative for pain, discharge and visual disturbance.  Respiratory: Negative for cough and shortness of breath.   Cardiovascular: Negative for chest pain, palpitations and leg swelling.  Gastrointestinal: Negative for nausea, vomiting, abdominal pain, diarrhea, constipation, blood in stool, abdominal distention and anal bleeding.  Genitourinary: Negative for dysuria, urgency, frequency, hematuria, flank pain,  vaginal bleeding, vaginal discharge, difficulty urinating, vaginal pain and pelvic pain.  Musculoskeletal: Negative for joint swelling, arthralgias and gait problem.  Skin: Negative for rash.  Neurological: Negative for dizziness, syncope, speech difficulty, weakness, numbness and headaches.  Hematological: Negative for adenopathy.  Psychiatric/Behavioral: Negative for behavioral problems, dysphoric mood and agitation. The patient is not nervous/anxious.        Objective:   Physical Exam  Constitutional: She is oriented to person, place, and time. She appears well-developed and well-nourished.  The pressure 130/80  HENT:  Head: Normocephalic.  Right Ear: External ear normal.  Left Ear: External ear normal.  Mouth/Throat: Oropharynx is clear and moist.  Eyes: Conjunctivae and EOM are normal. Pupils are equal, round, and reactive to light.  Neck: Normal range of motion. Neck supple. No thyromegaly present.  Cardiovascular: Normal rate, regular rhythm, normal heart sounds and intact distal pulses.   Rhythm seems  Regular (h/o CAF)  Pulmonary/Chest: Effort normal. She has rales.  Few crackles right base  Abdominal: Soft. Bowel sounds are normal. She exhibits no mass. There is no tenderness.  Musculoskeletal: Normal range of motion. She exhibits no edema.  Lymphadenopathy:    She has no cervical adenopathy.  Neurological: She is alert and oriented to person, place, and time.  Skin: Skin is warm and dry. No rash noted.  Psychiatric: She has a normal mood and affect. Her behavior is normal.          Assessment & Plan:   Lower GI bleed secondary to diverticular disease Acute blood loss anemia. We'll check a CBC today. Will place on a iron supplement for one month Chronic atrial fibrillation. If patient remains stable resume iron therapy in one week at a dose of 80 mg once daily Hypertension stable  Recheck 1 month

## 2013-06-19 NOTE — Patient Instructions (Signed)
Resume aspirin 81 mg daily in one week  Iron 1 tablet daily for 1 month  Return here for followup in one month  Call or return to clinic prn if these symptoms worsen or fail to improve as anticipated.

## 2013-07-05 ENCOUNTER — Telehealth: Payer: Self-pay | Admitting: Internal Medicine

## 2013-07-05 MED ORDER — AMLODIPINE BESYLATE 5 MG PO TABS: 5.0000 mg | ORAL_TABLET | Freq: Every morning | ORAL | Status: DC

## 2013-07-05 MED ORDER — LANSOPRAZOLE 30 MG PO CPDR: 30.0000 mg | DELAYED_RELEASE_CAPSULE | Freq: Every day | ORAL | Status: DC | PRN

## 2013-07-05 NOTE — Telephone Encounter (Signed)
Left detailed message Rx's sent to pharmacy as requested. 

## 2013-07-05 NOTE — Telephone Encounter (Signed)
PT daughter called to request a 3 month refill of her amLODipine (NORVASC) 5 MG tablet, and lansoprazole (PREVACID) 30 MG capsule. She would like these sent to the [facility] on [address]. Please assist.

## 2013-07-11 DIAGNOSIS — I10 Essential (primary) hypertension: Secondary | ICD-10-CM

## 2013-07-11 DIAGNOSIS — K922 Gastrointestinal hemorrhage, unspecified: Secondary | ICD-10-CM

## 2013-07-11 DIAGNOSIS — I4891 Unspecified atrial fibrillation: Secondary | ICD-10-CM

## 2013-07-11 DIAGNOSIS — I4892 Unspecified atrial flutter: Secondary | ICD-10-CM

## 2013-07-11 DIAGNOSIS — K921 Melena: Secondary | ICD-10-CM

## 2013-07-17 ENCOUNTER — Encounter: Payer: Self-pay | Admitting: Internal Medicine

## 2013-07-17 ENCOUNTER — Ambulatory Visit (INDEPENDENT_AMBULATORY_CARE_PROVIDER_SITE_OTHER): Payer: Medicare Other | Admitting: Internal Medicine

## 2013-07-17 VITALS — BP 130/70 | HR 61 | Temp 98.2°F | Resp 20 | Wt 93.0 lb

## 2013-07-17 DIAGNOSIS — D62 Acute posthemorrhagic anemia: Secondary | ICD-10-CM

## 2013-07-17 DIAGNOSIS — K922 Gastrointestinal hemorrhage, unspecified: Secondary | ICD-10-CM

## 2013-07-17 DIAGNOSIS — I4891 Unspecified atrial fibrillation: Secondary | ICD-10-CM

## 2013-07-17 DIAGNOSIS — I10 Essential (primary) hypertension: Secondary | ICD-10-CM

## 2013-07-17 LAB — CBC WITH DIFFERENTIAL/PLATELET
Basophils Absolute: 0 10*3/uL (ref 0.0–0.1)
HCT: 39.5 % (ref 36.0–46.0)
Hemoglobin: 13 g/dL (ref 12.0–15.0)
Lymphs Abs: 1.4 10*3/uL (ref 0.7–4.0)
MCHC: 33 g/dL (ref 30.0–36.0)
Monocytes Relative: 6.2 % (ref 3.0–12.0)
Neutro Abs: 3.7 10*3/uL (ref 1.4–7.7)
RDW: 15.8 % — ABNORMAL HIGH (ref 11.5–14.6)

## 2013-07-17 LAB — BASIC METABOLIC PANEL
BUN: 12 mg/dL (ref 6–23)
CO2: 32 mEq/L (ref 19–32)
Calcium: 9.2 mg/dL (ref 8.4–10.5)
Creatinine, Ser: 0.7 mg/dL (ref 0.4–1.2)

## 2013-07-17 NOTE — Progress Notes (Signed)
Subjective:    Patient ID: Cynthia Graham, female    DOB: 02-21-1917, 79 y.o.   MRN: [id number]  HPI  79 year old patient who is seen today for followup. She's had a recent hospital admission for GI bleeding and secondary anemia. She has chronic atrial fibrillation and iron therapy was resumed one month ago. She remains on supplemental iron. She feels quite well today.  Past Medical History  Diagnosis Date  . ANXIETY 05/17/2008  . ATRIAL FIBRILLATION WITH RAPID VENTRICULAR RESPONSE 10/09/2009  . CALLUS, RIGHT FOOT 02/11/2009  . EPISTAXIS, RECURRENT 01/15/2010  . GAIT IMBALANCE 08/16/2008  . GERD 08/08/2007  . HYPERLIPIDEMIA 05/17/2008  . HYPERTENSION 08/08/2007  . OSTEOARTHRITIS 05/17/2008  . SOB 08/18/2007  . WEAKNESS 05/17/2008  . DJD (degenerative joint disease)   . History of hiatal hernia   . Stroke     History   Social History  . Marital Status: Married    Spouse Name: N/A    Number of Children: N/A  . Years of Education: N/A   Occupational History  . Not on file.   Social History Main Topics  . Smoking status: Former Smoker    Types: Cigarettes    Quit date: 12/27/1968  . Smokeless tobacco: Never Used  . Alcohol Use: No  . Drug Use: No  . Sexually Active: No   Other Topics Concern  . Not on file   Social History Narrative  . No narrative on file    Past Surgical History  Procedure Laterality Date  . Abdominal hysterectomy    . Esophagogastroduodenoscopy      stricture, hiatal hernia ,reflux esopg.  . Right hip fracture  October 2009    Dr Charlann Boxer  . Colonoscopy N/A 06/12/2013    Procedure: COLONOSCOPY;  Surgeon: Meryl Dare, MD;  Location: [facility];  Service: Endoscopy;  Laterality: N/A;    Family History  Problem Relation Age of Onset  . Hypertension Neg Hx     family    No Known Allergies  Current Outpatient Prescriptions on File Prior to Visit  Medication Sig Dispense Refill  . albuterol (PROVENTIL HFA;VENTOLIN HFA) 108 (90 BASE) MCG/ACT  inhaler Inhale 2 puffs into the lungs every 6 (six) hours as needed. wheezing      . amLODipine (NORVASC) 5 MG tablet Take 1 tablet (5 mg total) by mouth every morning.  90 tablet  1  . benazepril (LOTENSIN) 40 MG tablet Take 40 mg by mouth every morning.      . furosemide (LASIX) 40 MG tablet Take 1 tablet (40 mg total) by mouth daily.  30 tablet  0  . lansoprazole (PREVACID) 30 MG capsule Take 1 capsule (30 mg total) by mouth daily as needed. heartburn  90 capsule  1  . LORazepam (ATIVAN) 0.5 MG tablet Take 0.5 mg by mouth every 8 (eight) hours as needed for anxiety.      . metoprolol (LOPRESSOR) 100 MG tablet Take 100 mg by mouth 2 (two) times daily.      . Multiple Vitamin (MULTIVITAMIN WITH MINERALS) TABS Take 1 tablet by mouth daily.      . promethazine (PHENERGAN) 12.5 MG tablet Take 12.5 mg by mouth every 8 (eight) hours as needed. nausea       No current facility-administered medications on file prior to visit.    BP 130/70  Pulse 61  Temp(Src) 98.2 F (36.8 C) (Oral)  Resp 20  Wt 93 lb (42.185 kg)  BMI 16.48 kg/m2  SpO2  97%       Review of Systems  Constitutional: Positive for fatigue.  HENT: Negative for hearing loss, congestion, sore throat, rhinorrhea, dental problem, sinus pressure and tinnitus.   Eyes: Negative for pain, discharge and visual disturbance.  Respiratory: Negative for cough and shortness of breath.   Cardiovascular: Negative for chest pain, palpitations and leg swelling.  Gastrointestinal: Negative for nausea, vomiting, abdominal pain, diarrhea, constipation, blood in stool and abdominal distention.  Genitourinary: Negative for dysuria, urgency, frequency, hematuria, flank pain, vaginal bleeding, vaginal discharge, difficulty urinating, vaginal pain and pelvic pain.  Musculoskeletal: Negative for joint swelling, arthralgias and gait problem.  Skin: Negative for rash.  Neurological: Negative for dizziness, syncope, speech difficulty, weakness, numbness  and headaches.  Hematological: Negative for adenopathy.  Psychiatric/Behavioral: Negative for behavioral problems, dysphoric mood and agitation. The patient is not nervous/anxious.        Objective:   Physical Exam  Constitutional: She is oriented to person, place, and time. She appears well-developed and well-nourished.  HENT:  Head: Normocephalic.  Right Ear: External ear normal.  Left Ear: External ear normal.  Mouth/Throat: Oropharynx is clear and moist.  Eyes: Conjunctivae and EOM are normal. Pupils are equal, round, and reactive to light.  Neck: Normal range of motion. Neck supple. No thyromegaly present.  Cardiovascular: Normal rate, normal heart sounds and intact distal pulses.   Controlled ventricular response  Pulmonary/Chest: Effort normal. She has rales.  Bibasilar crackles right greater than left  Abdominal: Soft. Bowel sounds are normal. She exhibits no mass. There is no tenderness.  Musculoskeletal: Normal range of motion.  Lymphadenopathy:    She has no cervical adenopathy.  Neurological: She is alert and oriented to person, place, and time.  Skin: Skin is warm and dry. No rash noted.  Psychiatric: She has a normal mood and affect. Her behavior is normal.          Assessment & Plan:  History of anemia secondary to GI bleeding. We'll check a CBC today. We'll continue iron supplementation Hypertension stable Chronic atrial fibrillation stable

## 2013-07-17 NOTE — Patient Instructions (Signed)
Limit your sodium (Salt) intake  Return in 3 months for follow-up   

## 2013-08-17 ENCOUNTER — Telehealth: Payer: Self-pay | Admitting: Internal Medicine

## 2013-08-17 MED ORDER — BENAZEPRIL HCL 40 MG PO TABS: 40.0000 mg | ORAL_TABLET | Freq: Every morning | ORAL | Status: DC

## 2013-08-17 NOTE — Telephone Encounter (Signed)
Pt request refill of benazepril (LOTENSIN) 40 MG tablet 1/day  90 day refill [facility] [address]

## 2013-09-07 ENCOUNTER — Ambulatory Visit: Payer: Medicare Other | Admitting: Internal Medicine

## 2013-09-07 DIAGNOSIS — Z0289 Encounter for other administrative examinations: Secondary | ICD-10-CM

## 2013-10-01 ENCOUNTER — Ambulatory Visit: Payer: Medicare Other | Admitting: Internal Medicine

## 2013-10-16 ENCOUNTER — Encounter: Payer: Self-pay | Admitting: Internal Medicine

## 2013-10-16 ENCOUNTER — Ambulatory Visit (INDEPENDENT_AMBULATORY_CARE_PROVIDER_SITE_OTHER): Payer: Medicare Other | Admitting: Internal Medicine

## 2013-10-16 VITALS — BP 128/80 | HR 60 | Temp 97.3°F | Resp 18 | Wt 93.0 lb

## 2013-10-16 DIAGNOSIS — I4891 Unspecified atrial fibrillation: Secondary | ICD-10-CM

## 2013-10-16 DIAGNOSIS — I10 Essential (primary) hypertension: Secondary | ICD-10-CM

## 2013-10-16 DIAGNOSIS — Z23 Encounter for immunization: Secondary | ICD-10-CM

## 2013-10-16 DIAGNOSIS — R269 Unspecified abnormalities of gait and mobility: Secondary | ICD-10-CM

## 2013-10-16 DIAGNOSIS — I4892 Unspecified atrial flutter: Secondary | ICD-10-CM

## 2013-10-16 DIAGNOSIS — K922 Gastrointestinal hemorrhage, unspecified: Secondary | ICD-10-CM

## 2013-10-16 LAB — CBC WITH DIFFERENTIAL/PLATELET
Basophils Absolute: 0 10*3/uL (ref 0.0–0.1)
Basophils Relative: 0.3 % (ref 0.0–3.0)
Eosinophils Absolute: 0.1 10*3/uL (ref 0.0–0.7)
Lymphocytes Relative: 29.6 % (ref 12.0–46.0)
MCHC: 32.8 g/dL (ref 30.0–36.0)
Neutrophils Relative %: 62 % (ref 43.0–77.0)
Platelets: 144 10*3/uL — ABNORMAL LOW (ref 150.0–400.0)
RBC: 4.16 Mil/uL (ref 3.87–5.11)
RDW: 16.2 % — ABNORMAL HIGH (ref 11.5–14.6)

## 2013-10-16 MED ORDER — LORAZEPAM 0.5 MG PO TABS: 0.5000 mg | ORAL_TABLET | Freq: Three times a day (TID) | ORAL | Status: DC | PRN

## 2013-10-16 MED ORDER — AMLODIPINE BESYLATE 5 MG PO TABS: 5.0000 mg | ORAL_TABLET | Freq: Every morning | ORAL | Status: DC

## 2013-10-16 MED ORDER — FUROSEMIDE 40 MG PO TABS: 40.0000 mg | ORAL_TABLET | Freq: Every day | ORAL | Status: DC

## 2013-10-16 MED ORDER — METOPROLOL TARTRATE 100 MG PO TABS: 100.0000 mg | ORAL_TABLET | Freq: Two times a day (BID) | ORAL | Status: DC

## 2013-10-16 MED ORDER — ASPIRIN 81 MG PO TABS: 81.0000 mg | ORAL_TABLET | Freq: Every day | ORAL | Status: DC

## 2013-10-16 MED ORDER — LANSOPRAZOLE 30 MG PO CPDR: 30.0000 mg | DELAYED_RELEASE_CAPSULE | Freq: Every day | ORAL | Status: DC | PRN

## 2013-10-16 MED ORDER — BENAZEPRIL HCL 40 MG PO TABS: 40.0000 mg | ORAL_TABLET | Freq: Every morning | ORAL | Status: DC

## 2013-10-16 NOTE — Patient Instructions (Signed)
Limit your sodium (Salt) intake  Return in 6 months for follow-up  

## 2013-10-16 NOTE — Progress Notes (Signed)
Subjective:    Patient ID: Cynthia Graham, female    DOB: 1917/01/12, 79 y.o.   MRN: [id number]  HPI  79 year old patient who is seen today for her quarterly followup.  She has a history of paroxysmal atrial fibrillation more recently in the setting of community-acquired pneumonia. She basically remains in a normal sinus rhythm. She was hospitalized earlier for lower GI bleeding requiring blood transfusions this was felt secondary to diverticular disease. Presently she is on low-dose aspirin on also has a history of unsteady gait and is felt not to be a good candidate for anticoagulation. She is treated hypertension which has been stable. She feels well today.  Past Medical History  Diagnosis Date  . ANXIETY 05/17/2008  . ATRIAL FIBRILLATION WITH RAPID VENTRICULAR RESPONSE 10/09/2009  . CALLUS, RIGHT FOOT 02/11/2009  . EPISTAXIS, RECURRENT 01/15/2010  . GAIT IMBALANCE 08/16/2008  . GERD 08/08/2007  . HYPERLIPIDEMIA 05/17/2008  . HYPERTENSION 08/08/2007  . OSTEOARTHRITIS 05/17/2008  . SOB 08/18/2007  . WEAKNESS 05/17/2008  . DJD (degenerative joint disease)   . History of hiatal hernia   . Stroke     History   Social History  . Marital Status: Married    Spouse Name: N/A    Number of Children: N/A  . Years of Education: N/A   Occupational History  . Not on file.   Social History Main Topics  . Smoking status: Former Smoker    Types: Cigarettes    Quit date: 12/27/1968  . Smokeless tobacco: Never Used  . Alcohol Use: No  . Drug Use: No  . Sexual Activity: No   Other Topics Concern  . Not on file   Social History Narrative  . No narrative on file    Past Surgical History  Procedure Laterality Date  . Abdominal hysterectomy    . Esophagogastroduodenoscopy      stricture, hiatal hernia ,reflux esopg.  . Right hip fracture  October 2009    Dr Charlann Boxer  . Colonoscopy N/A 06/12/2013    Procedure: COLONOSCOPY;  Surgeon: Meryl Dare, MD;  Location: [facility];  Service:  Endoscopy;  Laterality: N/A;    Family History  Problem Relation Age of Onset  . Hypertension Neg Hx     family    No Known Allergies  Current Outpatient Prescriptions on File Prior to Visit  Medication Sig Dispense Refill  . albuterol (PROVENTIL HFA;VENTOLIN HFA) 108 (90 BASE) MCG/ACT inhaler Inhale 2 puffs into the lungs every 6 (six) hours as needed. wheezing      . amLODipine (NORVASC) 5 MG tablet Take 1 tablet (5 mg total) by mouth every morning.  90 tablet  1  . benazepril (LOTENSIN) 40 MG tablet Take 1 tablet (40 mg total) by mouth every morning.  90 tablet  0  . furosemide (LASIX) 40 MG tablet Take 1 tablet (40 mg total) by mouth daily.  30 tablet  0  . lansoprazole (PREVACID) 30 MG capsule Take 1 capsule (30 mg total) by mouth daily as needed. heartburn  90 capsule  1  . LORazepam (ATIVAN) 0.5 MG tablet Take 0.5 mg by mouth every 8 (eight) hours as needed for anxiety.      . metoprolol (LOPRESSOR) 100 MG tablet Take 100 mg by mouth 2 (two) times daily.      . Multiple Vitamin (MULTIVITAMIN WITH MINERALS) TABS Take 1 tablet by mouth daily.       No current facility-administered medications on file prior to visit.  BP 128/80  Pulse 60  Temp(Src) 97.3 F (36.3 C) (Oral)  Resp 18  Wt 93 lb (42.185 kg)  BMI 16.48 kg/m2       Review of Systems  Constitutional: Negative.   HENT: Negative for congestion, dental problem, hearing loss, rhinorrhea, sinus pressure, sore throat and tinnitus.   Eyes: Negative for pain, discharge and visual disturbance.  Respiratory: Negative for cough and shortness of breath.   Cardiovascular: Negative for chest pain, palpitations and leg swelling.  Gastrointestinal: Negative for nausea, vomiting, abdominal pain, diarrhea, constipation, blood in stool and abdominal distention.  Genitourinary: Negative for dysuria, urgency, frequency, hematuria, flank pain, vaginal bleeding, vaginal discharge, difficulty urinating, vaginal pain and pelvic  pain.  Musculoskeletal: Negative for arthralgias, gait problem and joint swelling.  Skin: Negative for rash.  Neurological: Negative for dizziness, syncope, speech difficulty, weakness, numbness and headaches.  Hematological: Negative for adenopathy.  Psychiatric/Behavioral: Negative for behavioral problems, dysphoric mood and agitation. The patient is not nervous/anxious.        Objective:   Physical Exam  Constitutional: She is oriented to person, place, and time. She appears well-developed and well-nourished.  HENT:  Head: Normocephalic.  Right Ear: External ear normal.  Left Ear: External ear normal.  Mouth/Throat: Oropharynx is clear and moist.  Eyes: Conjunctivae and EOM are normal. Pupils are equal, round, and reactive to light.  Neck: Normal range of motion. Neck supple. No thyromegaly present.  Cardiovascular: Normal rate, regular rhythm, normal heart sounds and intact distal pulses.   Rhythm is regular  Pulmonary/Chest: Effort normal. She has rales.  Abdominal: Soft. Bowel sounds are normal. She exhibits no mass. There is no tenderness.  Musculoskeletal: Normal range of motion. She exhibits no edema.  Lymphadenopathy:    She has no cervical adenopathy.  Neurological: She is alert and oriented to person, place, and time.  Skin: Skin is warm and dry. No rash noted.  Psychiatric: She has a normal mood and affect. Her behavior is normal.          Assessment & Plan:   Hypertension well controlled History of anemia secondary to diverticular disease. We'll check a CBC. Continue iron supplements Paroxysmal atrial fibrillation continue aspirin 81 mg  Flu vaccine Recheck 6 months

## 2014-02-07 ENCOUNTER — Other Ambulatory Visit: Payer: Self-pay | Admitting: *Deleted

## 2014-02-07 ENCOUNTER — Encounter: Payer: Self-pay | Admitting: Internal Medicine

## 2014-02-07 ENCOUNTER — Ambulatory Visit (INDEPENDENT_AMBULATORY_CARE_PROVIDER_SITE_OTHER): Payer: Medicare Other | Admitting: Internal Medicine

## 2014-02-07 VITALS — BP 136/80 | HR 70 | Temp 98.2°F | Resp 18 | Ht 63.0 in | Wt 94.0 lb

## 2014-02-07 DIAGNOSIS — R8281 Pyuria: Secondary | ICD-10-CM

## 2014-02-07 DIAGNOSIS — I10 Essential (primary) hypertension: Secondary | ICD-10-CM

## 2014-02-07 DIAGNOSIS — R300 Dysuria: Secondary | ICD-10-CM

## 2014-02-07 DIAGNOSIS — I4892 Unspecified atrial flutter: Secondary | ICD-10-CM

## 2014-02-07 DIAGNOSIS — I4891 Unspecified atrial fibrillation: Secondary | ICD-10-CM

## 2014-02-07 DIAGNOSIS — R82998 Other abnormal findings in urine: Secondary | ICD-10-CM

## 2014-02-07 DIAGNOSIS — E785 Hyperlipidemia, unspecified: Secondary | ICD-10-CM

## 2014-02-07 LAB — POCT URINALYSIS DIPSTICK
BILIRUBIN UA: NEGATIVE
Glucose, UA: NEGATIVE
KETONES UA: NEGATIVE
Nitrite, UA: NEGATIVE
PH UA: 6.5
Protein, UA: NEGATIVE
SPEC GRAV UA: 1.015
Urobilinogen, UA: 0.2

## 2014-02-07 MED ORDER — LORAZEPAM 0.5 MG PO TABS: 0.5000 mg | ORAL_TABLET | Freq: Three times a day (TID) | ORAL | Status: DC | PRN

## 2014-02-07 MED ORDER — AMLODIPINE BESYLATE 5 MG PO TABS: 5.0000 mg | ORAL_TABLET | Freq: Every morning | ORAL | Status: DC

## 2014-02-07 MED ORDER — FUROSEMIDE 20 MG PO TABS: 20.0000 mg | ORAL_TABLET | Freq: Every day | ORAL | Status: DC

## 2014-02-07 MED ORDER — CIPROFLOXACIN HCL 500 MG PO TABS: 500.0000 mg | ORAL_TABLET | Freq: Two times a day (BID) | ORAL | Status: DC

## 2014-02-07 NOTE — Telephone Encounter (Signed)
Left detailed message Rx for Lorazepam was called into pharmacy.

## 2014-02-07 NOTE — Progress Notes (Signed)
Subjective:    Patient ID: Cynthia Graham, female    DOB: 06/27/1917, 80 y.o.   MRN: [id number]  HPI  80 year old patient who has a history of hypertension and paroxysmal atrial for correlation. She has treated hypertension. She basically has done well except for some burning dysuria. The UA did reveal small amount of pyuria as well as trace blood. No fever or chills. No cardiopulmonary complaints  Past Medical History  Diagnosis Date  . ANXIETY 05/17/2008  . ATRIAL FIBRILLATION WITH RAPID VENTRICULAR RESPONSE 10/09/2009  . CALLUS, RIGHT FOOT 02/11/2009  . EPISTAXIS, RECURRENT 01/15/2010  . GAIT IMBALANCE 08/16/2008  . GERD 08/08/2007  . HYPERLIPIDEMIA 05/17/2008  . HYPERTENSION 08/08/2007  . OSTEOARTHRITIS 05/17/2008  . SOB 08/18/2007  . WEAKNESS 05/17/2008  . DJD (degenerative joint disease)   . History of hiatal hernia   . Stroke     History   Social History  . Marital Status: Married    Spouse Name: N/A    Number of Children: N/A  . Years of Education: N/A   Occupational History  . Not on file.   Social History Main Topics  . Smoking status: Former Smoker    Types: Cigarettes    Quit date: 12/27/1968  . Smokeless tobacco: Never Used  . Alcohol Use: No  . Drug Use: No  . Sexual Activity: No   Other Topics Concern  . Not on file   Social History Narrative  . No narrative on file    Past Surgical History  Procedure Laterality Date  . Abdominal hysterectomy    . Esophagogastroduodenoscopy      stricture, hiatal hernia ,reflux esopg.  . Right hip fracture  October 2009    Dr Charlann Boxer  . Colonoscopy N/A 06/12/2013    Procedure: COLONOSCOPY;  Surgeon: Meryl Dare, MD;  Location: [facility];  Service: Endoscopy;  Laterality: N/A;    Family History  Problem Relation Age of Onset  . Hypertension Neg Hx     family    No Known Allergies  Current Outpatient Prescriptions on File Prior to Visit  Medication Sig Dispense Refill  . albuterol (PROVENTIL HFA;VENTOLIN  HFA) 108 (90 BASE) MCG/ACT inhaler Inhale 2 puffs into the lungs every 6 (six) hours as needed. wheezing      . aspirin 81 MG tablet Take 1 tablet (81 mg total) by mouth daily.  30 tablet    . benazepril (LOTENSIN) 40 MG tablet Take 1 tablet (40 mg total) by mouth every morning.  90 tablet  1  . furosemide (LASIX) 40 MG tablet Take 1 tablet (40 mg total) by mouth daily.  90 tablet  1  . lansoprazole (PREVACID) 30 MG capsule Take 1 capsule (30 mg total) by mouth daily as needed. heartburn  90 capsule  1  . metoprolol (LOPRESSOR) 100 MG tablet Take 1 tablet (100 mg total) by mouth 2 (two) times daily.  180 tablet  1  . Multiple Vitamin (MULTIVITAMIN WITH MINERALS) TABS Take 1 tablet by mouth daily.       No current facility-administered medications on file prior to visit.    BP 136/80  Pulse 70  Temp(Src) 98.2 F (36.8 C) (Oral)  Resp 18  Ht 5\' 3"  (1.6 m)  Wt 94 lb (42.638 kg)  BMI 16.66 kg/m2  SpO2 97%   The    Review of Systems  Constitutional: Negative.   HENT: Negative for congestion, dental problem, hearing loss, rhinorrhea, sinus pressure, sore throat and tinnitus.  Eyes: Negative for pain, discharge and visual disturbance.  Respiratory: Negative for cough and shortness of breath.   Cardiovascular: Negative for chest pain, palpitations and leg swelling.  Gastrointestinal: Negative for nausea, vomiting, abdominal pain, diarrhea, constipation, blood in stool and abdominal distention.  Genitourinary: Positive for dysuria and frequency. Negative for urgency, hematuria, flank pain, vaginal bleeding, vaginal discharge, difficulty urinating, vaginal pain and pelvic pain.  Musculoskeletal: Positive for gait problem. Negative for arthralgias and joint swelling.  Skin: Negative for rash.  Neurological: Negative for dizziness, syncope, speech difficulty, weakness, numbness and headaches.  Hematological: Negative for adenopathy.  Psychiatric/Behavioral: Negative for behavioral  problems, dysphoric mood and agitation. The patient is not nervous/anxious.        Objective:   Physical Exam  Constitutional: She is oriented to person, place, and time. She appears well-developed and well-nourished.  HENT:  Head: Normocephalic.  Right Ear: External ear normal.  Left Ear: External ear normal.  Mouth/Throat: Oropharynx is clear and moist.  Eyes: Conjunctivae and EOM are normal. Pupils are equal, round, and reactive to light.  Neck: Normal range of motion. Neck supple. No thyromegaly present.  Cardiovascular: Normal rate, regular rhythm, normal heart sounds and intact distal pulses.   Pulmonary/Chest: Effort normal and breath sounds normal.  Abdominal: Soft. Bowel sounds are normal. She exhibits no mass. There is no tenderness.  Musculoskeletal: Normal range of motion.  Lymphadenopathy:    She has no cervical adenopathy.  Neurological: She is alert and oriented to person, place, and time.  Skin: Skin is warm and dry. No rash noted.  Psychiatric: She has a normal mood and affect. Her behavior is normal.          Assessment & Plan:   Early UTI Hypertension well controlled History of paroxysmal atrial fibrillation Unsteady gait  We'll treat with Cipro for 3 days Decreased Lasix to 20 mg daily  Return in 3 months for followup

## 2014-02-07 NOTE — Progress Notes (Signed)
Pre-visit discussion using our clinic review tool. No additional management support is needed unless otherwise documented below in the visit note.  

## 2014-02-07 NOTE — Patient Instructions (Signed)
Limit your sodium (Salt) intake  Return in 3 months for follow-up  Take your antibiotic as prescribed until ALL of it is gone, but stop if you develop a rash, swelling, or any side effects of the medication.  Contact our office as soon as possible if  there are side effects of the medication.

## 2014-02-08 ENCOUNTER — Telehealth: Payer: Self-pay | Admitting: Internal Medicine

## 2014-02-08 NOTE — Telephone Encounter (Signed)
Relevant patient education mailed to patient.  

## 2014-04-16 ENCOUNTER — Ambulatory Visit (INDEPENDENT_AMBULATORY_CARE_PROVIDER_SITE_OTHER): Payer: Medicare Other | Admitting: Internal Medicine

## 2014-04-16 ENCOUNTER — Encounter: Payer: Self-pay | Admitting: Internal Medicine

## 2014-04-16 VITALS — BP 140/80 | HR 76 | Temp 97.4°F | Resp 18 | Ht 62.0 in | Wt 94.0 lb

## 2014-04-16 DIAGNOSIS — I10 Essential (primary) hypertension: Secondary | ICD-10-CM

## 2014-04-16 DIAGNOSIS — D62 Acute posthemorrhagic anemia: Secondary | ICD-10-CM

## 2014-04-16 DIAGNOSIS — M1990 Unspecified osteoarthritis, unspecified site: Secondary | ICD-10-CM

## 2014-04-16 DIAGNOSIS — I4891 Unspecified atrial fibrillation: Secondary | ICD-10-CM

## 2014-04-16 DIAGNOSIS — R269 Unspecified abnormalities of gait and mobility: Secondary | ICD-10-CM

## 2014-04-16 LAB — CBC WITH DIFFERENTIAL/PLATELET
BASOS PCT: 0.3 % (ref 0.0–3.0)
Basophils Absolute: 0 10*3/uL (ref 0.0–0.1)
EOS ABS: 0.1 10*3/uL (ref 0.0–0.7)
Eosinophils Relative: 1.1 % (ref 0.0–5.0)
HCT: 43.2 % (ref 36.0–46.0)
Hemoglobin: 14.2 g/dL (ref 12.0–15.0)
Lymphocytes Relative: 26 % (ref 12.0–46.0)
Lymphs Abs: 1.2 10*3/uL (ref 0.7–4.0)
MCHC: 32.7 g/dL (ref 30.0–36.0)
MCV: 95.1 fl (ref 78.0–100.0)
MONO ABS: 0.3 10*3/uL (ref 0.1–1.0)
Monocytes Relative: 5.6 % (ref 3.0–12.0)
NEUTROS PCT: 67 % (ref 43.0–77.0)
Neutro Abs: 3.2 10*3/uL (ref 1.4–7.7)
RBC: 4.55 Mil/uL (ref 3.87–5.11)
RDW: 15.1 % — AB (ref 11.5–14.6)
WBC: 4.7 10*3/uL (ref 4.5–10.5)

## 2014-04-16 LAB — BASIC METABOLIC PANEL
BUN: 9 mg/dL (ref 6–23)
CHLORIDE: 105 meq/L (ref 96–112)
CO2: 27 meq/L (ref 19–32)
CREATININE: 0.6 mg/dL (ref 0.4–1.2)
Calcium: 9.5 mg/dL (ref 8.4–10.5)
GFR: 117.23 mL/min (ref >60.00)
GLUCOSE: 69 mg/dL — AB (ref 70–99)
Potassium: 3.8 mEq/L (ref 3.5–5.1)
Sodium: 143 mEq/L (ref 135–145)

## 2014-04-16 MED ORDER — LORAZEPAM 0.5 MG PO TABS: 0.5000 mg | ORAL_TABLET | Freq: Three times a day (TID) | ORAL | Status: DC | PRN

## 2014-04-16 MED ORDER — POLYETHYLENE GLYCOL 3350 17 GM/SCOOP PO POWD: 17.0000 g | Freq: Two times a day (BID) | ORAL | Status: DC | PRN

## 2014-04-16 NOTE — Patient Instructions (Addendum)
Limit your sodium (Salt) intake  Discontinue furosemide  Return in 6 months for follow-up Constipation, Adult Constipation is when a person:  Poops (bowel movement) less than 3 times a week.  Has a hard time pooping.  Has poop that is dry, hard, or bigger than normal. HOME CARE   Eat more fiber, such as fruits, vegetables, whole grains like brown rice, and beans.  Eat less fatty foods and sugar. This includes Jamaica[place] fries, hamburgers, cookies, candy, and soda.  If you are not getting enough fiber from food, take products with added fiber in them (supplements).  Drink enough fluid to keep your pee (urine) clear or pale yellow.  Go to the restroom when you feel like you need to poop. Do not hold it.  Only take medicine as told by your doctor. Do not take medicines that help you poop (laxatives) without talking to your doctor first.  Exercise on a regular basis, or as told by your doctor. GET HELP RIGHT AWAY IF:   You have bright red blood in your poop (stool).  Your constipation lasts more than 4 days or gets worse.  You have belly (abdomen) or butt (rectal) pain.  You have thin poop (as thin as a pencil).  You lose weight, and it cannot be explained. MAKE SURE YOU:   Understand these instructions.  Will watch your condition.  Will get help right away if you are not doing well or get worse. Document Released: 05/31/2008 Document Revised: 03/06/2012 Document Reviewed: 09/24/2013 [facility] Patient Information 2014 [employer] [place].

## 2014-04-16 NOTE — Progress Notes (Signed)
Subjective:    Patient ID: Cynthia Graham, female    DOB: [birth year], 80 y.o.   MRN: [id number]  HPI 80 year old patient seen today for followup.  She has a history of atrial fibrillation, but also a history of GI bleeding compounded by anemia and unsteady gait and high fall risk.  She is on aspirin, but not full anticoagulation.  She has treated hypertension.  Her weight has been stable.  She has osteoarthritis.  She complains of some constipation issues, but no melena, or hematochezia.  She still complains of some urinary frequency.  She presently is on a dose of furosemide 20 daily.  Blood pressure well controlled today  Past Medical History  Diagnosis Date  . ANXIETY 05/17/2008  . ATRIAL FIBRILLATION WITH RAPID VENTRICULAR RESPONSE 10/09/2009  . CALLUS, RIGHT FOOT 02/11/2009  . EPISTAXIS, RECURRENT 01/15/2010  . GAIT IMBALANCE 08/16/2008  . GERD 08/08/2007  . HYPERLIPIDEMIA 05/17/2008  . HYPERTENSION 08/08/2007  . OSTEOARTHRITIS 05/17/2008  . SOB 08/18/2007  . WEAKNESS 05/17/2008  . DJD (degenerative joint disease)   . History of hiatal hernia   . Stroke     History   Social History  . Marital Status: Married    Spouse Name: N/A    Number of Children: N/A  . Years of Education: N/A   Occupational History  . Not on file.   Social History Main Topics  . Smoking status: Former Smoker    Types: Cigarettes    Quit date: 12/27/1968  . Smokeless tobacco: Never Used  . Alcohol Use: No  . Drug Use: No  . Sexual Activity: No   Other Topics Concern  . Not on file   Social History Narrative  . No narrative on file    Past Surgical History  Procedure Laterality Date  . Abdominal hysterectomy    . Esophagogastroduodenoscopy      stricture, hiatal hernia ,reflux esopg.  . Right hip fracture  October 2009    Dr Charlann Boxerlin  . Colonoscopy N/A 06/12/2013    Procedure: COLONOSCOPY;  Surgeon: Meryl DareMalcolm T Stark, MD;  Location: [facility];  Service: Endoscopy;  Laterality: N/A;    Family  History  Problem Relation Age of Onset  . Hypertension Neg Hx     family    No Known Allergies  Current Outpatient Prescriptions on File Prior to Visit  Medication Sig Dispense Refill  . amLODipine (NORVASC) 5 MG tablet Take 1 tablet (5 mg total) by mouth every morning.  90 tablet  1  . aspirin 81 MG tablet Take 1 tablet (81 mg total) by mouth daily.  30 tablet    . benazepril (LOTENSIN) 40 MG tablet Take 1 tablet (40 mg total) by mouth every morning.  90 tablet  1  . furosemide (LASIX) 20 MG tablet Take 1 tablet (20 mg total) by mouth daily.  90 tablet  3  . lansoprazole (PREVACID) 30 MG capsule Take 1 capsule (30 mg total) by mouth daily as needed. heartburn  90 capsule  1  . metoprolol (LOPRESSOR) 100 MG tablet Take 1 tablet (100 mg total) by mouth 2 (two) times daily.  180 tablet  1  . Multiple Vitamin (MULTIVITAMIN WITH MINERALS) TABS Take 1 tablet by mouth daily.      Marland Kitchen. albuterol (PROVENTIL HFA;VENTOLIN HFA) 108 (90 BASE) MCG/ACT inhaler Inhale 2 puffs into the lungs every 6 (six) hours as needed. wheezing       No current facility-administered medications on file prior to visit.  BP 140/80  Pulse 76  Temp(Src) 97.4 F (36.3 C) (Oral)  Resp 18  Ht 5\' 2"  (1.575 m)  Wt 94 lb (42.638 kg)  BMI 17.19 kg/m2     Review of Systems  HENT: Negative for congestion, dental problem, hearing loss, rhinorrhea, sinus pressure, sore throat and tinnitus.   Eyes: Negative for pain, discharge and visual disturbance.  Respiratory: Negative for cough and shortness of breath.   Cardiovascular: Negative for chest pain, palpitations and leg swelling.  Gastrointestinal: Positive for constipation. Negative for nausea, vomiting, abdominal pain, diarrhea, blood in stool and abdominal distention.  Genitourinary: Negative for dysuria, urgency, frequency, hematuria, flank pain, vaginal bleeding, vaginal discharge, difficulty urinating, vaginal pain and pelvic pain.  Musculoskeletal: Negative for  arthralgias, gait problem and joint swelling.  Skin: Negative for rash.  Neurological: Positive for weakness. Negative for dizziness, syncope, speech difficulty, numbness and headaches.  Hematological: Negative for adenopathy.  Psychiatric/Behavioral: Negative for behavioral problems, dysphoric mood and agitation. The patient is not nervous/anxious.        Objective:   Physical Exam  Constitutional: She is oriented to person, place, and time. She appears well-developed and well-nourished.  Alert frail, no distress Unsteady gait Blood pressure 130/80  HENT:  Head: Normocephalic.  Right Ear: External ear normal.  Left Ear: External ear normal.  Mouth/Throat: Oropharynx is clear and moist.  Eyes: Conjunctivae and EOM are normal. Pupils are equal, round, and reactive to light.  Neck: Normal range of motion. Neck supple. No thyromegaly present.  Cardiovascular: Normal rate, normal heart sounds and intact distal pulses.   Irregular rhythm with a controlled ventricular response  Pulmonary/Chest: Effort normal and breath sounds normal.  Abdominal: Soft. Bowel sounds are normal. She exhibits no mass. There is no tenderness.  Musculoskeletal: Normal range of motion. She exhibits no edema.  Lymphadenopathy:    She has no cervical adenopathy.  Neurological: She is alert and oriented to person, place, and time.  Skin: Skin is warm and dry. No rash noted.  Psychiatric: She has a normal mood and affect. Her behavior is normal.          Assessment & Plan:   Atrial fibrillation Gait abnormality History of GI bleed and anemia Hypertension well controlled.  We'll discontinue furosemide Constipation.  Issues addressed  Recheck 6 months Check CBC and Bmet

## 2014-04-16 NOTE — Progress Notes (Signed)
Pre-visit discussion using our clinic review tool. No additional management support is needed unless otherwise documented below in the visit note.  

## 2014-04-16 NOTE — Progress Notes (Signed)
   Subjective:    Patient ID: Cynthia Graham, female    DOB: Nov 14, 1917, 80 y.o.   MRN: [id number]  HPI  Wt Readings from Last 3 Encounters:  04/16/14 94 lb (42.638 kg)  02/07/14 94 lb (42.638 kg)  10/16/13 93 lb (42.185 kg)    Review of Systems     Objective:   Physical Exam        Assessment & Plan:

## 2014-05-07 ENCOUNTER — Ambulatory Visit: Payer: Medicare Other | Admitting: Internal Medicine

## 2014-05-08 ENCOUNTER — Telehealth: Payer: Self-pay | Admitting: Internal Medicine

## 2014-05-08 ENCOUNTER — Encounter: Payer: Self-pay | Admitting: Internal Medicine

## 2014-05-08 ENCOUNTER — Ambulatory Visit (INDEPENDENT_AMBULATORY_CARE_PROVIDER_SITE_OTHER): Payer: Medicare Other | Admitting: Internal Medicine

## 2014-05-08 VITALS — BP 110/70 | HR 65 | Temp 98.0°F | Resp 18 | Ht 62.0 in | Wt 93.0 lb

## 2014-05-08 DIAGNOSIS — M1990 Unspecified osteoarthritis, unspecified site: Secondary | ICD-10-CM

## 2014-05-08 DIAGNOSIS — I10 Essential (primary) hypertension: Secondary | ICD-10-CM

## 2014-05-08 DIAGNOSIS — R079 Chest pain, unspecified: Secondary | ICD-10-CM

## 2014-05-08 MED ORDER — BENAZEPRIL HCL 40 MG PO TABS: 40.0000 mg | ORAL_TABLET | Freq: Every morning | ORAL | Status: DC

## 2014-05-08 MED ORDER — TRAMADOL HCL 50 MG PO TABS: 50.0000 mg | ORAL_TABLET | Freq: Three times a day (TID) | ORAL | Status: DC | PRN

## 2014-05-08 NOTE — Patient Instructions (Signed)
Call or return to clinic prn if these symptoms worsen or fail to improve as anticipated.

## 2014-05-08 NOTE — Progress Notes (Signed)
Pre-visit discussion using our clinic review tool. No additional management support is needed unless otherwise documented below in the visit note.  

## 2014-05-08 NOTE — Telephone Encounter (Signed)
Relevant patient education mailed to patient.  

## 2014-05-08 NOTE — Progress Notes (Signed)
Subjective:    Patient ID: Cynthia Graham, female    DOB: 19-Jun-1917, 80 y.o.   MRN: [id number]  HPI  80 year old patient who has treated hypertension.  She has osteoarthritis.  3 days ago she rolled off the side of her bed, sustaining trauma to the right chest wall area.  Pain is fairly modest and not responsive to, or ibuprofen.  She is requesting a stronger analgesic.  Otherwise, doing quite well.  She is seen for followup one month ago.  No new concerns or complaints except for the right chest wall discomfort.  Past Medical History  Diagnosis Date  . ANXIETY 05/17/2008  . ATRIAL FIBRILLATION WITH RAPID VENTRICULAR RESPONSE 10/09/2009  . CALLUS, RIGHT FOOT 02/11/2009  . EPISTAXIS, RECURRENT 01/15/2010  . GAIT IMBALANCE 08/16/2008  . GERD 08/08/2007  . HYPERLIPIDEMIA 05/17/2008  . HYPERTENSION 08/08/2007  . OSTEOARTHRITIS 05/17/2008  . SOB 08/18/2007  . WEAKNESS 05/17/2008  . DJD (degenerative joint disease)   . History of hiatal hernia   . Stroke     History   Social History  . Marital Status: Married    Spouse Name: N/A    Number of Children: N/A  . Years of Education: N/A   Occupational History  . Not on file.   Social History Main Topics  . Smoking status: Former Smoker    Types: Cigarettes    Quit date: 12/27/1968  . Smokeless tobacco: Never Used  . Alcohol Use: No  . Drug Use: No  . Sexual Activity: No   Other Topics Concern  . Not on file   Social History Narrative  . No narrative on file    Past Surgical History  Procedure Laterality Date  . Abdominal hysterectomy    . Esophagogastroduodenoscopy      stricture, hiatal hernia ,reflux esopg.  . Right hip fracture  October 2009    Dr Charlann Boxerlin  . Colonoscopy N/A 06/12/2013    Procedure: COLONOSCOPY;  Surgeon: Meryl DareMalcolm T Stark, MD;  Location: [facility];  Service: Endoscopy;  Laterality: N/A;    Family History  Problem Relation Age of Onset  . Hypertension Neg Hx     family    No Known Allergies  Current  Outpatient Prescriptions on File Prior to Visit  Medication Sig Dispense Refill  . albuterol (PROVENTIL HFA;VENTOLIN HFA) 108 (90 BASE) MCG/ACT inhaler Inhale 2 puffs into the lungs every 6 (six) hours as needed. wheezing      . amLODipine (NORVASC) 5 MG tablet Take 1 tablet (5 mg total) by mouth every morning.  90 tablet  1  . aspirin 81 MG tablet Take 1 tablet (81 mg total) by mouth daily.  30 tablet    . lansoprazole (PREVACID) 30 MG capsule Take 1 capsule (30 mg total) by mouth daily as needed. heartburn  90 capsule  1  . LORazepam (ATIVAN) 0.5 MG tablet Take 1 tablet (0.5 mg total) by mouth every 8 (eight) hours as needed for anxiety.  60 tablet  5  . metoprolol (LOPRESSOR) 100 MG tablet Take 1 tablet (100 mg total) by mouth 2 (two) times daily.  180 tablet  1  . Multiple Vitamin (MULTIVITAMIN WITH MINERALS) TABS Take 1 tablet by mouth daily.      . polyethylene glycol powder (GLYCOLAX/MIRALAX) powder Take 17 g by mouth 2 (two) times daily as needed.  3350 g  1   No current facility-administered medications on file prior to visit.    BP 110/70  Pulse 65  Temp(Src) 98 F (36.7 C) (Oral)  Resp 18  Ht 5\' 2"  (1.575 m)  Wt 93 lb (42.185 kg)  BMI 17.01 kg/m2  SpO2 97%       Review of Systems  Constitutional: Negative.   HENT: Negative for congestion, dental problem, hearing loss, rhinorrhea, sinus pressure, sore throat and tinnitus.   Eyes: Negative for pain, discharge and visual disturbance.  Respiratory: Negative for cough and shortness of breath.   Cardiovascular: Positive for chest pain. Negative for palpitations and leg swelling.  Gastrointestinal: Negative for nausea, vomiting, abdominal pain, diarrhea, constipation, blood in stool and abdominal distention.  Genitourinary: Negative for dysuria, urgency, frequency, hematuria, flank pain, vaginal bleeding, vaginal discharge, difficulty urinating, vaginal pain and pelvic pain.  Musculoskeletal: Negative for arthralgias, gait  problem and joint swelling.  Skin: Negative for rash.  Neurological: Negative for dizziness, syncope, speech difficulty, weakness, numbness and headaches.  Hematological: Negative for adenopathy.  Psychiatric/Behavioral: Negative for behavioral problems, dysphoric mood and agitation. The patient is not nervous/anxious.        Objective:   Physical Exam  Constitutional: She is oriented to person, place, and time. She appears well-developed and well-nourished.  HENT:  Head: Normocephalic.  Right Ear: External ear normal.  Left Ear: External ear normal.  Mouth/Throat: Oropharynx is clear and moist.  Eyes: Conjunctivae and EOM are normal. Pupils are equal, round, and reactive to light.  Neck: Normal range of motion. Neck supple. No thyromegaly present.  Cardiovascular: Normal rate, regular rhythm, normal heart sounds and intact distal pulses.   Pulmonary/Chest: Effort normal. She has rales. She exhibits tenderness.  A few crackles at the right base Mild right chest wall tenderness to gentle palpation  Abdominal: Soft. Bowel sounds are normal. She exhibits no mass. There is no tenderness.  Musculoskeletal: Normal range of motion.  Lymphadenopathy:    She has no cervical adenopathy.  Neurological: She is alert and oriented to person, place, and time.  Skin: Skin is warm and dry. No rash noted.  Psychiatric: She has a normal mood and affect. Her behavior is normal.          Assessment & Plan:   Contusion right chest wall Hypertension stable  We'll treat with Tylenol short-term Blood pressure regimen unchanged Recheck 6 months

## 2014-05-21 ENCOUNTER — Telehealth: Payer: Self-pay | Admitting: Internal Medicine

## 2014-05-21 DIAGNOSIS — R5383 Other fatigue: Principal | ICD-10-CM

## 2014-05-21 DIAGNOSIS — R269 Unspecified abnormalities of gait and mobility: Secondary | ICD-10-CM

## 2014-05-21 DIAGNOSIS — R5381 Other malaise: Secondary | ICD-10-CM

## 2014-05-21 NOTE — Telephone Encounter (Signed)
Pt's daughter states that the pt is needing a order from dr. Kirtland Bouchard to send out a home health nurse to teach the pt how to use her walker. Pt fell on yesterday and is complaining about having trouble walking. Daughter states she is not sure if she is showing her correctly on how to use it.

## 2014-05-22 NOTE — Telephone Encounter (Signed)
Dr. Kirtland Bouchard, okay to order Home Health?

## 2014-05-23 NOTE — Telephone Encounter (Signed)
ok 

## 2014-05-23 NOTE — Telephone Encounter (Signed)
Spoke to pt's daughter Ruel Favors, told her referral to home health was ordered and someone will get in touch with her. [name] verbalized understanding.

## 2014-05-27 ENCOUNTER — Telehealth: Payer: Self-pay | Admitting: Internal Medicine

## 2014-05-27 NOTE — Telephone Encounter (Signed)
Called [facility] and left detailed message for Flo on personal voicemail, verbal order given to start Physical Therapy on pt per Dr. Kirtland Bouchard. Any questions call office.

## 2014-05-27 NOTE — Telephone Encounter (Signed)
Okay for Physical Therapy orders?

## 2014-05-27 NOTE — Telephone Encounter (Signed)
Cynthia Graham is needing home health orders for physical therapy for the pt.would like to start the therapy tomorrow if possible.

## 2014-05-27 NOTE — Telephone Encounter (Signed)
ok 

## 2014-05-27 NOTE — Telephone Encounter (Signed)
Flo from [facility] called back and said she needs order faxed. Told her okay will fax order for PT. Order faxed to [phone].

## 2014-05-31 ENCOUNTER — Encounter (HOSPITAL_COMMUNITY): Payer: Self-pay | Admitting: Emergency Medicine

## 2014-05-31 ENCOUNTER — Encounter (HOSPITAL_COMMUNITY)
Admission: EM | Disposition: A | Discharge status: Skilled Nursing Facility | Payer: Self-pay | Source: Home / Self Care | Attending: Internal Medicine

## 2014-05-31 ENCOUNTER — Emergency Department (HOSPITAL_COMMUNITY)
Admission: EM | Admit: 2014-05-31 | Discharge: 2014-05-31 | Discharge status: Against Medical Advice | Payer: Medicare Other

## 2014-05-31 ENCOUNTER — Inpatient Hospital Stay (HOSPITAL_COMMUNITY)
Admission: EM | Admit: 2014-05-31 | Discharge: 2014-06-03 | DRG: 470 | Disposition: A | Discharge status: Skilled Nursing Facility | Payer: Medicare Other | Attending: Internal Medicine | Admitting: Internal Medicine

## 2014-05-31 ENCOUNTER — Inpatient Hospital Stay (HOSPITAL_COMMUNITY): Payer: Medicare Other | Admitting: Anesthesiology

## 2014-05-31 ENCOUNTER — Ambulatory Visit (INDEPENDENT_AMBULATORY_CARE_PROVIDER_SITE_OTHER): Payer: Medicare Other | Admitting: Internal Medicine

## 2014-05-31 ENCOUNTER — Encounter: Payer: Self-pay | Admitting: Internal Medicine

## 2014-05-31 ENCOUNTER — Encounter (HOSPITAL_COMMUNITY): Payer: Medicare Other | Admitting: Anesthesiology

## 2014-05-31 ENCOUNTER — Emergency Department (HOSPITAL_COMMUNITY): Payer: Medicare Other

## 2014-05-31 VITALS — BP 140/80 | HR 64 | Temp 97.5°F | Resp 18

## 2014-05-31 DIAGNOSIS — I10 Essential (primary) hypertension: Secondary | ICD-10-CM

## 2014-05-31 DIAGNOSIS — Z87891 Personal history of nicotine dependence: Secondary | ICD-10-CM

## 2014-05-31 DIAGNOSIS — I959 Hypotension, unspecified: Secondary | ICD-10-CM

## 2014-05-31 DIAGNOSIS — M25552 Pain in left hip: Secondary | ICD-10-CM

## 2014-05-31 DIAGNOSIS — D62 Acute posthemorrhagic anemia: Secondary | ICD-10-CM

## 2014-05-31 DIAGNOSIS — E876 Hypokalemia: Secondary | ICD-10-CM

## 2014-05-31 DIAGNOSIS — M1990 Unspecified osteoarthritis, unspecified site: Secondary | ICD-10-CM

## 2014-05-31 DIAGNOSIS — R269 Unspecified abnormalities of gait and mobility: Secondary | ICD-10-CM | POA: Diagnosis present

## 2014-05-31 DIAGNOSIS — I498 Other specified cardiac arrhythmias: Secondary | ICD-10-CM | POA: Diagnosis not present

## 2014-05-31 DIAGNOSIS — Z8673 Personal history of transient ischemic attack (TIA), and cerebral infarction without residual deficits: Secondary | ICD-10-CM

## 2014-05-31 DIAGNOSIS — E875 Hyperkalemia: Secondary | ICD-10-CM | POA: Diagnosis present

## 2014-05-31 DIAGNOSIS — J189 Pneumonia, unspecified organism: Secondary | ICD-10-CM

## 2014-05-31 DIAGNOSIS — K921 Melena: Secondary | ICD-10-CM

## 2014-05-31 DIAGNOSIS — I4892 Unspecified atrial flutter: Secondary | ICD-10-CM | POA: Diagnosis present

## 2014-05-31 DIAGNOSIS — E785 Hyperlipidemia, unspecified: Secondary | ICD-10-CM

## 2014-05-31 DIAGNOSIS — L84 Corns and callosities: Secondary | ICD-10-CM

## 2014-05-31 DIAGNOSIS — R8281 Pyuria: Secondary | ICD-10-CM

## 2014-05-31 DIAGNOSIS — M25559 Pain in unspecified hip: Secondary | ICD-10-CM

## 2014-05-31 DIAGNOSIS — R5381 Other malaise: Secondary | ICD-10-CM

## 2014-05-31 DIAGNOSIS — K219 Gastro-esophageal reflux disease without esophagitis: Secondary | ICD-10-CM | POA: Diagnosis present

## 2014-05-31 DIAGNOSIS — F411 Generalized anxiety disorder: Secondary | ICD-10-CM | POA: Diagnosis present

## 2014-05-31 DIAGNOSIS — E46 Unspecified protein-calorie malnutrition: Secondary | ICD-10-CM

## 2014-05-31 DIAGNOSIS — T465X5A Adverse effect of other antihypertensive drugs, initial encounter: Secondary | ICD-10-CM | POA: Diagnosis not present

## 2014-05-31 DIAGNOSIS — S72009A Fracture of unspecified part of neck of unspecified femur, initial encounter for closed fracture: Principal | ICD-10-CM

## 2014-05-31 DIAGNOSIS — R040 Epistaxis: Secondary | ICD-10-CM

## 2014-05-31 DIAGNOSIS — W010XXA Fall on same level from slipping, tripping and stumbling without subsequent striking against object, initial encounter: Secondary | ICD-10-CM | POA: Diagnosis present

## 2014-05-31 DIAGNOSIS — I4891 Unspecified atrial fibrillation: Secondary | ICD-10-CM

## 2014-05-31 DIAGNOSIS — K922 Gastrointestinal hemorrhage, unspecified: Secondary | ICD-10-CM

## 2014-05-31 DIAGNOSIS — R0602 Shortness of breath: Secondary | ICD-10-CM

## 2014-05-31 DIAGNOSIS — Z79899 Other long term (current) drug therapy: Secondary | ICD-10-CM

## 2014-05-31 DIAGNOSIS — K089 Disorder of teeth and supporting structures, unspecified: Secondary | ICD-10-CM

## 2014-05-31 DIAGNOSIS — R5383 Other fatigue: Principal | ICD-10-CM

## 2014-05-31 DIAGNOSIS — J90 Pleural effusion, not elsewhere classified: Secondary | ICD-10-CM

## 2014-05-31 DIAGNOSIS — S72002A Fracture of unspecified part of neck of left femur, initial encounter for closed fracture: Secondary | ICD-10-CM

## 2014-05-31 DIAGNOSIS — Z7982 Long term (current) use of aspirin: Secondary | ICD-10-CM

## 2014-05-31 HISTORY — PX: HIP ARTHROPLASTY: SHX981

## 2014-05-31 LAB — BASIC METABOLIC PANEL
BUN: 20 mg/dL (ref 6–23)
CALCIUM: 9.2 mg/dL (ref 8.4–10.5)
CO2: 23 meq/L (ref 19–32)
CREATININE: 0.63 mg/dL (ref 0.50–1.10)
Chloride: 101 mEq/L (ref 96–112)
GFR calc Af Amer: 86 mL/min — ABNORMAL LOW (ref >90)
GFR, EST NON AFRICAN AMERICAN: 74 mL/min — AB (ref >90)
GLUCOSE: 77 mg/dL (ref 70–99)
Potassium: 5.1 mEq/L (ref 3.7–5.3)
SODIUM: 141 meq/L (ref 137–147)

## 2014-05-31 LAB — TYPE AND SCREEN
ABO/RH(D): O POS
Antibody Screen: NEGATIVE

## 2014-05-31 LAB — CBC WITH DIFFERENTIAL/PLATELET
BASOS PCT: 0 % (ref 0–1)
Basophils Absolute: 0 10*3/uL (ref 0.0–0.1)
Eosinophils Absolute: 0.1 10*3/uL (ref 0.0–0.7)
Eosinophils Relative: 1 % (ref 0–5)
HCT: 39.8 % (ref 36.0–46.0)
Hemoglobin: 12.9 g/dL (ref 12.0–15.0)
Lymphocytes Relative: 12 % (ref 12–46)
Lymphs Abs: 0.7 10*3/uL (ref 0.7–4.0)
MCH: 30.4 pg (ref 26.0–34.0)
MCHC: 32.4 g/dL (ref 30.0–36.0)
MCV: 93.6 fL (ref 78.0–100.0)
Monocytes Absolute: 0.6 10*3/uL (ref 0.1–1.0)
Monocytes Relative: 10 % (ref 3–12)
NEUTROS PCT: 77 % (ref 43–77)
Neutro Abs: 4.7 10*3/uL (ref 1.7–7.7)
PLATELETS: 233 10*3/uL (ref 150–400)
RBC: 4.25 MIL/uL (ref 3.87–5.11)
RDW: 15.5 % (ref 11.5–15.5)
WBC: 6.1 10*3/uL (ref 4.0–10.5)

## 2014-05-31 LAB — PROTIME-INR
INR: 1.15 (ref 0.00–1.49)
PROTHROMBIN TIME: 14.5 s (ref 11.6–15.2)

## 2014-05-31 SURGERY — HEMIARTHROPLASTY, HIP, DIRECT ANTERIOR APPROACH, FOR FRACTURE
Anesthesia: General | Site: Hip | Laterality: Left

## 2014-05-31 MED ORDER — ENOXAPARIN SODIUM 40 MG/0.4ML ~~LOC~~ SOLN: 40.0000 mg | SUBCUTANEOUS | Status: DC

## 2014-05-31 MED ORDER — FENTANYL CITRATE 0.05 MG/ML IJ SOLN
INTRAMUSCULAR | Status: AC
  Filled 2014-05-31: qty 5

## 2014-05-31 MED ORDER — ONDANSETRON HCL 4 MG/2ML IJ SOLN
INTRAMUSCULAR | Status: AC
  Filled 2014-05-31: qty 2

## 2014-05-31 MED ORDER — CEFAZOLIN SODIUM-DEXTROSE 2-3 GM-% IV SOLR
INTRAVENOUS | Status: AC
  Filled 2014-05-31: qty 50

## 2014-05-31 MED ORDER — LORAZEPAM 2 MG/ML IJ SOLN: 0.2500 mg | Freq: Two times a day (BID) | INTRAMUSCULAR | Status: DC | PRN

## 2014-05-31 MED ORDER — METOPROLOL TARTRATE 1 MG/ML IV SOLN
2.5000 mg | Freq: Four times a day (QID) | INTRAVENOUS | Status: DC
  Administered 2014-05-31: 2.5 mg via INTRAVENOUS
  Filled 2014-05-31: qty 5

## 2014-05-31 MED ORDER — MORPHINE SULFATE 2 MG/ML IJ SOLN
2.0000 mg | INTRAMUSCULAR | Status: DC | PRN
  Administered 2014-05-31: 2 mg via INTRAVENOUS
  Filled 2014-05-31: qty 1

## 2014-05-31 MED ORDER — LACTATED RINGERS IV SOLN
INTRAVENOUS | Status: DC | PRN
  Administered 2014-05-31 (×2): via INTRAVENOUS

## 2014-05-31 MED ORDER — ONDANSETRON HCL 4 MG/2ML IJ SOLN
INTRAMUSCULAR | Status: DC | PRN
  Administered 2014-05-31: 4 mg via INTRAVENOUS

## 2014-05-31 MED ORDER — LABETALOL HCL 5 MG/ML IV SOLN
INTRAVENOUS | Status: AC
  Filled 2014-05-31: qty 4

## 2014-05-31 MED ORDER — PHENYLEPHRINE 40 MCG/ML (10ML) SYRINGE FOR IV PUSH (FOR BLOOD PRESSURE SUPPORT)
PREFILLED_SYRINGE | INTRAVENOUS | Status: AC
  Filled 2014-05-31: qty 10

## 2014-05-31 MED ORDER — FENTANYL CITRATE 0.05 MG/ML IJ SOLN
INTRAMUSCULAR | Status: DC | PRN
  Administered 2014-05-31 (×4): 50 ug via INTRAVENOUS

## 2014-05-31 MED ORDER — NEOSTIGMINE METHYLSULFATE 10 MG/10ML IV SOLN
INTRAVENOUS | Status: AC
  Filled 2014-05-31: qty 1

## 2014-05-31 MED ORDER — LIDOCAINE HCL (CARDIAC) 20 MG/ML IV SOLN
INTRAVENOUS | Status: AC
  Filled 2014-05-31: qty 5

## 2014-05-31 MED ORDER — NEOSTIGMINE METHYLSULFATE 10 MG/10ML IV SOLN
INTRAVENOUS | Status: DC | PRN
  Administered 2014-05-31: 3 mg via INTRAVENOUS

## 2014-05-31 MED ORDER — LIDOCAINE HCL (CARDIAC) 20 MG/ML IV SOLN
INTRAVENOUS | Status: DC | PRN
  Administered 2014-05-31: 50 mg via INTRAVENOUS

## 2014-05-31 MED ORDER — CEFAZOLIN SODIUM-DEXTROSE 2-3 GM-% IV SOLR
INTRAVENOUS | Status: DC | PRN
  Administered 2014-05-31: 2 g via INTRAVENOUS

## 2014-05-31 MED ORDER — PROPOFOL 10 MG/ML IV BOLUS
INTRAVENOUS | Status: DC | PRN
  Administered 2014-05-31: 80 mg via INTRAVENOUS

## 2014-05-31 MED ORDER — GLYCOPYRROLATE 0.2 MG/ML IJ SOLN
INTRAMUSCULAR | Status: AC
  Filled 2014-05-31: qty 3

## 2014-05-31 MED ORDER — ACETAMINOPHEN 325 MG PO TABS: 650.0000 mg | ORAL_TABLET | Freq: Four times a day (QID) | ORAL | Status: AC | PRN

## 2014-05-31 MED ORDER — LABETALOL HCL 5 MG/ML IV SOLN
INTRAVENOUS | Status: DC | PRN
  Administered 2014-05-31: 5 mg via INTRAVENOUS

## 2014-05-31 MED ORDER — PROPOFOL 10 MG/ML IV BOLUS
INTRAVENOUS | Status: AC
  Filled 2014-05-31: qty 20

## 2014-05-31 MED ORDER — GLYCOPYRROLATE 0.2 MG/ML IJ SOLN
INTRAMUSCULAR | Status: DC | PRN
  Administered 2014-05-31: .6 mg via INTRAVENOUS

## 2014-05-31 MED ORDER — ROCURONIUM BROMIDE 100 MG/10ML IV SOLN
INTRAVENOUS | Status: DC | PRN
  Administered 2014-05-31: 30 mg via INTRAVENOUS

## 2014-05-31 MED ORDER — ROCURONIUM BROMIDE 100 MG/10ML IV SOLN
INTRAVENOUS | Status: AC
  Filled 2014-05-31: qty 1

## 2014-05-31 MED ORDER — PHENYLEPHRINE HCL 10 MG/ML IJ SOLN
INTRAMUSCULAR | Status: DC | PRN
  Administered 2014-05-31: 40 ug via INTRAVENOUS

## 2014-05-31 SURGICAL SUPPLY — 37 items
BAG ZIPLOCK 12X15 (MISCELLANEOUS) ×3 IMPLANT
BENZOIN TINCTURE PRP APPL 2/3 (GAUZE/BANDAGES/DRESSINGS) ×3 IMPLANT
BLADE SAW SAG 73X25 THK (BLADE) ×2
BLADE SAW SGTL 73X25 THK (BLADE) ×1 IMPLANT
CAPT HIP FX BIPOLAR/UNIPOLAR ×3 IMPLANT
CEMENT HV SMART SET (Cement) ×6 IMPLANT
CEMENT RESTRICTOR DEPUY SZ 4 (Cement) ×3 IMPLANT
CLOSURE WOUND 1/2 X4 (GAUZE/BANDAGES/DRESSINGS)
DRAPE INCISE IOBAN 66X45 STRL (DRAPES) ×3 IMPLANT
DRAPE ORTHO SPLIT 77X108 STRL (DRAPES) ×4
DRAPE POUCH INSTRU U-SHP 10X18 (DRAPES) ×3 IMPLANT
DRAPE SURG ORHT 6 SPLT 77X108 (DRAPES) ×2 IMPLANT
DRAPE U-SHAPE 47X51 STRL (DRAPES) ×3 IMPLANT
DRSG EMULSION OIL 3X16 NADH (GAUZE/BANDAGES/DRESSINGS) ×3 IMPLANT
DRSG MEPILEX BORDER 4X4 (GAUZE/BANDAGES/DRESSINGS) IMPLANT
DRSG MEPILEX BORDER 4X8 (GAUZE/BANDAGES/DRESSINGS) ×3 IMPLANT
ELECT REM PT RETURN 9FT ADLT (ELECTROSURGICAL) ×3
ELECTRODE REM PT RTRN 9FT ADLT (ELECTROSURGICAL) ×1 IMPLANT
EVACUATOR 1/8 PVC DRAIN (DRAIN) IMPLANT
GLOVE ORTHO TXT STRL SZ7.5 (GLOVE) ×3 IMPLANT
GLOVE SURG ORTHO 8.5 STRL (GLOVE) ×3 IMPLANT
GOWN STRL REUS W/TWL LRG LVL3 (GOWN DISPOSABLE) ×6 IMPLANT
IMMOBILIZER KNEE 20 (SOFTGOODS) IMPLANT
MANIFOLD NEPTUNE II (INSTRUMENTS) ×3 IMPLANT
PACK TOTAL JOINT (CUSTOM PROCEDURE TRAY) ×3 IMPLANT
POSITIONER SURGICAL ARM (MISCELLANEOUS) ×3 IMPLANT
SPONGE GAUZE 4X4 12PLY (GAUZE/BANDAGES/DRESSINGS) IMPLANT
STAPLER VISISTAT 35W (STAPLE) ×3 IMPLANT
STRIP CLOSURE SKIN 1/2X4 (GAUZE/BANDAGES/DRESSINGS) IMPLANT
SUT ETHIBOND NAB CT1 #1 30IN (SUTURE) ×6 IMPLANT
SUT MNCRL AB 4-0 PS2 18 (SUTURE) IMPLANT
SUT VIC AB 1 CT1 36 (SUTURE) ×6 IMPLANT
SUT VIC AB 2-0 CT1 27 (SUTURE) ×4
SUT VIC AB 2-0 CT1 TAPERPNT 27 (SUTURE) ×2 IMPLANT
TOWEL OR 17X26 10 PK STRL BLUE (TOWEL DISPOSABLE) ×6 IMPLANT
TOWER CARTRIDGE SMART MIX (DISPOSABLE) ×3 IMPLANT
TRAY FOLEY CATH 14FRSI W/METER (CATHETERS) ×3 IMPLANT

## 2014-05-31 NOTE — ED Notes (Signed)
Called to give report nurse unavailable will call back.  

## 2014-05-31 NOTE — Progress Notes (Signed)
Clinical Social Work Department BRIEF PSYCHOSOCIAL ASSESSMENT 05/31/2014  Patient:  Cynthia Graham,Cynthia Graham     Account Number:  [id number]     Admit date:  05/31/2014  Clinical Social Worker:  ,, LCSWA  Date/Time:  05/31/2014 08:15 PM  Referred by:  CSW  Date Referred:  05/31/2014  Other Referral:   Interview type:  Patient Other interview type:   family at bedside    PSYCHOSOCIAL DATA Living Status:  FAMILY Admitted from facility:   Level of care:   Primary support name:  [name] Mclean Primary support relationship to patient:  CHILD, ADULT Degree of support available:   High    CURRENT CONCERNS  Other Concerns:    SOCIAL WORK ASSESSMENT / PLAN CSW met with the patient and family at bedside to complete assessment.  Patient presents as oriented x4, alert, calm, and cooperative. Patient reports she currently has home health services and will continue with that after the surgery.  The patient and family are refusing SNF at this time.  CSW did not provide SNF resources at this time.   Assessment/plan status:  No Further Intervention Required Other assessment/ plan:   Information/referral to community resources:   Patient and family refused SNF at this time.    PATIENT'S/FAMILY'S RESPONSE TO PLAN OF CARE: Patient and family expressed their appreciation of the support from the social work department.     , MSW, LCSWA, 05/31/2014 Evening Clinical Social Worker [phone]    

## 2014-05-31 NOTE — Discharge Instructions (Signed)
Ice to the left hip please.  WBAT left LE with assistance  Keep incision clean and dry for one week  Follow up with Dr Ranell Patrick in 2 weeks.

## 2014-05-31 NOTE — ED Provider Notes (Addendum)
CSN: [id number]     Arrival date & time 05/31/14  1709 History   First MD Initiated Contact with Patient 05/31/14 1721     Chief Complaint  Patient presents with  . Hip Pain   HPI Patient presents to the emergency room because of persistent left hip pain after a fall. The patient slipped in the bathroom on [place] Day. Since that time she has been having pain in her left hip. Patient has not been able to walk properly she says since the fall. Her family has actually been having to carry her. Patient states the pain is in the left hip. It is severe. She cannot walk now her leg. Denies any other injuries. She's not having any back pain. She denies any numbness or weakness. Past Medical History  Diagnosis Date  . ANXIETY 05/17/2008  . ATRIAL FIBRILLATION WITH RAPID VENTRICULAR RESPONSE 10/09/2009  . CALLUS, RIGHT FOOT 02/11/2009  . EPISTAXIS, RECURRENT 01/15/2010  . GAIT IMBALANCE 08/16/2008  . GERD 08/08/2007  . HYPERLIPIDEMIA 05/17/2008  . HYPERTENSION 08/08/2007  . OSTEOARTHRITIS 05/17/2008  . SOB 08/18/2007  . WEAKNESS 05/17/2008  . DJD (degenerative joint disease)   . History of hiatal hernia   . Stroke    Past Surgical History  Procedure Laterality Date  . Abdominal hysterectomy    . Esophagogastroduodenoscopy      stricture, hiatal hernia ,reflux esopg.  . Right hip fracture  October 2009    Dr Charlann Boxer  . Colonoscopy N/A 06/12/2013    Procedure: COLONOSCOPY;  Surgeon: Meryl Dare, MD;  Location: [facility];  Service: Endoscopy;  Laterality: N/A;   Family History  Problem Relation Age of Onset  . Hypertension Neg Hx     family   History  Substance Use Topics  . Smoking status: Former Smoker    Types: Cigarettes    Quit date: 12/27/1968  . Smokeless tobacco: Never Used  . Alcohol Use: No   OB History   Grav Para Term Preterm Abortions TAB SAB Ect Mult Living                 Review of Systems  All other systems reviewed and are negative.     Allergies  Review of  patient's allergies indicates no known allergies.  Home Medications   Prior to Admission medications   Medication Sig Start Date End Date Taking? Authorizing Provider  albuterol (PROVENTIL HFA;VENTOLIN HFA) 108 (90 BASE) MCG/ACT inhaler Inhale 2 puffs into the lungs every 6 (six) hours as needed for wheezing.    Yes Historical Provider, MD  amLODipine (NORVASC) 5 MG tablet Take 5 mg by mouth every morning.   Yes Historical Provider, MD  aspirin EC 81 MG tablet Take 81 mg by mouth every morning.   Yes Historical Provider, MD  benazepril (LOTENSIN) 40 MG tablet Take 40 mg by mouth every morning.   Yes Historical Provider, MD  furosemide (LASIX) 20 MG tablet Take 20 mg by mouth every morning.   Yes Historical Provider, MD  lansoprazole (PREVACID) 30 MG capsule Take 30 mg by mouth daily as needed (GERD/heartburn).   Yes Historical Provider, MD  LORazepam (ATIVAN) 0.5 MG tablet Take 0.5 mg by mouth every 8 (eight) hours as needed for anxiety.   Yes Historical Provider, MD  metoprolol (LOPRESSOR) 100 MG tablet Take 100 mg by mouth 2 (two) times daily.   Yes Historical Provider, MD  Multiple Vitamin (MULTIVITAMIN WITH MINERALS) TABS Take 1 tablet by mouth every morning.  Yes Historical Provider, MD  naproxen sodium (ANAPROX) 220 MG tablet Take 220 mg by mouth 2 (two) times daily as needed (pain).   Yes Historical Provider, MD  polyethylene glycol (MIRALAX / GLYCOLAX) packet Take 17 g by mouth 2 (two) times daily as needed for mild constipation.   Yes Historical Provider, MD   BP 166/74  Pulse 76  Temp(Src) 97.7 F (36.5 C) (Oral)  Resp 16  SpO2 97% Physical Exam  Nursing note and vitals reviewed. Constitutional: She appears well-nourished. No distress.  Frail, elderly  HENT:  Head: Normocephalic and atraumatic.  Right Ear: External ear normal.  Left Ear: External ear normal.  Eyes: Conjunctivae are normal. Right eye exhibits no discharge. Left eye exhibits no discharge. No scleral icterus.   Neck: Neck supple. No tracheal deviation present.  Cardiovascular: Normal rate, regular rhythm and intact distal pulses.   Pulmonary/Chest: Effort normal and breath sounds normal. No stridor. No respiratory distress. She has no wheezes. She has no rales.  Abdominal: Soft. Bowel sounds are normal. She exhibits no distension. There is no tenderness. There is no rebound and no guarding.  Musculoskeletal: She exhibits no edema.       Left hip: She exhibits tenderness and bony tenderness. She exhibits no crepitus.  Limited range of motion left hip, movement causes severe pain, no gross swelling  Neurological: She is alert. She has normal strength. No cranial nerve deficit (no facial droop, extraocular movements intact, no slurred speech) or sensory deficit. She exhibits normal muscle tone. She displays no seizure activity. Coordination normal.  Skin: Skin is warm. No rash noted. She is not diaphoretic.  Psychiatric: She has a normal mood and affect.    ED Course  Procedures (including critical care time) Labs Review Labs Reviewed  BASIC METABOLIC PANEL - Abnormal; Notable for the following:    GFR calc non Af Amer 74 (*)    GFR calc Af Amer 86 (*)    All other components within normal limits  CBC WITH DIFFERENTIAL  PROTIME-INR  TYPE AND SCREEN    Imaging Review Dg Hip Complete Left  05/31/2014   CLINICAL DATA:  History of trauma from a fall.  Left-sided hip pain.  EXAM: LEFT HIP - COMPLETE 2+ VIEW  COMPARISON:  No priors.  FINDINGS: Four views of the bony pelvis and left hip demonstrate an acute displaced transcervical neck fracture. This appears mildly impacted, with slight posterior displacement of the distal fracture fragment. The femoral head remains located in the acetabulum. Remaining portions of the bony pelvis and the visualized right proximal femur appear intact. Status post right hip hemiarthroplasty with a bipolar prosthesis.  IMPRESSION: 1. Acute displaced mildly impacted  transcervical neck fracture of the left proximal femur.   Electronically Signed   By: Trudie Reedaniel  Entrikin M.D.   On: 05/31/2014 18:49    Medications  morphine 2 MG/ML injection 2 mg (2 mg Intravenous Given 05/31/14 1814)     MDM   Final diagnoses:  Closed left hip fracture    Discussed findings with patient and family.  Pt has a left femoral neck fracture.  Will consult with medical service for admission.  Consult with orthopedics, previous surgery with Dr Charlann Boxerolin.    Linwood DibblesJon Takima [place], MD 05/31/14 1942 I spoke with Dr Lovell SheehanJenkins who will admit. Dr Ranell PatrickNorris is  in the ED , consulting on the patient.    Linwood DibblesJon Cheril Slattery, MD 05/31/14 2009

## 2014-05-31 NOTE — ED Notes (Signed)
Patient returned from X-ray 

## 2014-05-31 NOTE — Patient Instructions (Signed)
Report to the emergency department immediately for x-ray evaluation and possible admission for left hip fracture

## 2014-05-31 NOTE — H&P (Signed)
[facility] History and Physical  Cynthia Graham GNF:[id number] DOB: 1917/06/22 DOA: 05/31/2014  Referring physician:  PCP: Rogelia BogaKWIATKOWSKI,PETER FRANK, MD  Specialists:   Chief Complaint: Left Hip Pain  HPI: Cynthia FleetRuth D Goodnow is a 80 y.o. female with a history of Atrial Fibrillation, HTN, Hyperlipidemia, and Gait Instability who suffered a fall on Memorial day and has been bed-bound at home due to increase pain ever since.  She denies having any syncope or chest pain associated with her fall, she reports that her legs became weak and she fell.   She was taken to her PCP and was evaluated and sent to the ED for further evaluation, and an x-ray was performed which revealed an acute mildly displaced proximal left femoral neck fracture.   Orthopedics was consulted and saw patient in the ED and planned for surgery this evening.      Review of Systems:  Constitutional: +Weight Loss, No Weight Gain, Night Sweats, Fevers, Chills, Fatigue, +Generalized Weakness HEENT: No Headaches, Difficulty Swallowing,Tooth/Dental Problems,Sore Throat,  No Sneezing, Rhinitis, Ear Ache, Nasal Congestion, or Post Nasal Drip,  Cardio-vascular:  No Chest pain, Orthopnea, PND, Edema in lower extremities, Anasarca, Dizziness, Palpitations  Resp: No Dyspnea, No DOE, No Cough, No Hemoptysis,  No Wheezing.    GI: No Heartburn, Indigestion, Abdominal Pain, Nausea, Vomiting, Diarrhea, Change in Bowel Habits,  +Loss of Appetite  GU: No Dysuria, Change in Color of Urine, No Urgency or Frequency.  No flank pain.  Musculoskeletal: No Joint Pain or Swelling.  No Decreased Range of Motion. No Back Pain.  Neurologic: No Syncope, No Seizures, +Muscle Weakness, Paresthesia, Vision Disturbance or Loss, No Diplopia, No Vertigo, No +Difficulty Walking,  Skin: No Rash or Lesions. Psych: No Change in Mood or Affect. No Depression or Anxiety. No Memory loss. No Confusion or Hallucinations   Past Medical History  Diagnosis Date  . ANXIETY  05/17/2008  . ATRIAL FIBRILLATION WITH RAPID VENTRICULAR RESPONSE 10/09/2009  . CALLUS, RIGHT FOOT 02/11/2009  . EPISTAXIS, RECURRENT 01/15/2010  . GAIT IMBALANCE 08/16/2008  . GERD 08/08/2007  . HYPERLIPIDEMIA 05/17/2008  . HYPERTENSION 08/08/2007  . OSTEOARTHRITIS 05/17/2008  . SOB 08/18/2007  . WEAKNESS 05/17/2008  . DJD (degenerative joint disease)   . History of hiatal hernia   . Stroke       Past Surgical History  Procedure Laterality Date  . Abdominal hysterectomy    . Esophagogastroduodenoscopy      stricture, hiatal hernia ,reflux esopg.  . Right hip fracture  October 2009    Dr Charlann Boxerlin  . Colonoscopy N/A 06/12/2013    Procedure: COLONOSCOPY;  Surgeon: Meryl DareMalcolm T Stark, MD;  Location: [facility];  Service: Endoscopy;  Laterality: N/A;      Prior to Admission medications   Medication Sig Start Date End Date Taking? Authorizing Provider  albuterol (PROVENTIL HFA;VENTOLIN HFA) 108 (90 BASE) MCG/ACT inhaler Inhale 2 puffs into the lungs every 6 (six) hours as needed for wheezing.    Yes Historical Provider, MD  amLODipine (NORVASC) 5 MG tablet Take 5 mg by mouth every morning.   Yes Historical Provider, MD  aspirin EC 81 MG tablet Take 81 mg by mouth every morning.   Yes Historical Provider, MD  benazepril (LOTENSIN) 40 MG tablet Take 40 mg by mouth every morning.   Yes Historical Provider, MD  furosemide (LASIX) 20 MG tablet Take 20 mg by mouth every morning.   Yes Historical Provider, MD  lansoprazole (PREVACID) 30 MG capsule Take 30 mg by mouth  daily as needed (GERD/heartburn).   Yes Historical Provider, MD  LORazepam (ATIVAN) 0.5 MG tablet Take 0.5 mg by mouth every 8 (eight) hours as needed for anxiety.   Yes Historical Provider, MD  metoprolol (LOPRESSOR) 100 MG tablet Take 100 mg by mouth 2 (two) times daily.   Yes Historical Provider, MD  Multiple Vitamin (MULTIVITAMIN WITH MINERALS) TABS Take 1 tablet by mouth every morning.    Yes Historical Provider, MD  naproxen sodium  (ANAPROX) 220 MG tablet Take 220 mg by mouth 2 (two) times daily as needed (pain).   Yes Historical Provider, MD  polyethylene glycol (MIRALAX / GLYCOLAX) packet Take 17 g by mouth 2 (two) times daily as needed for mild constipation.   Yes Historical Provider, MD     No Known Allergies   Social History:  reports that she quit smoking about 45 years ago. Her smoking use included Cigarettes. She smoked 0.00 packs per day. She has never used smokeless tobacco. She reports that she does not drink alcohol or use illicit drugs.     Family History  Problem Relation Age of Onset  . Hypertension Neg Hx     family     Physical Exam:  GEN:  Pleasant Cachectic Elderly 80 y.o. African American female  examined  and in no acute distress; cooperative with exam Filed Vitals:   05/31/14 1738 05/31/14 2025  BP: 166/74 160/73  Pulse: 76 77  Temp: 97.7 F (36.5 C) 98.1 F (36.7 C)  TempSrc: Oral Oral  Resp: 16 21  SpO2: 97% 97%   Blood pressure 160/73, pulse 77, temperature 98.1 F (36.7 C), temperature source Oral, resp. rate 21, SpO2 97.00%. PSYCH: She is alert and oriented x4; does not appear anxious does not appear depressed; affect is normal HEENT: Normocephalic and Atraumatic, Mucous membranes pink; PERRLA; EOM intact; Fundi:  Benign;  No scleral icterus, Nares: Patent, Oropharynx: Clear, Poor Sparse  Dentition, Neck:  FROM, no cervical lymphadenopathy nor thyromegaly or carotid bruit; no JVD; Breasts:: Not examined CHEST WALL: No tenderness CHEST: Normal respiration, clear to auscultation bilaterally HEART: Regular rate and rhythm; no murmurs rubs or gallops BACK: No kyphosis or scoliosis; no CVA tenderness ABDOMEN: Positive Bowel Sounds, Scaphoid,  soft non-tender; no masses, no organomegaly. Rectal Exam: Not done EXTREMITIES: No cyanosis, clubbing or edema; no ulcerations. Genitalia: not examined PULSES: 2+ and symmetric SKIN: Normal hydration no rash or ulceration CNS:  Alert and  Oriented X 4,  No Focal Deficits.   Gait Deferred Vascular: pulses palpable throughout    Labs on Admission:  Basic Metabolic Panel:  Recent Labs Lab 05/31/14 1805  NA 141  K 5.1  CL 101  CO2 23  GLUCOSE 77  BUN 20  CREATININE 0.63  CALCIUM 9.2   Liver Function Tests: No results found for this basename: AST, ALT, ALKPHOS, BILITOT, PROT, ALBUMIN,  in the last 168 hours No results found for this basename: LIPASE, AMYLASE,  in the last 168 hours No results found for this basename: AMMONIA,  in the last 168 hours CBC:  Recent Labs Lab 05/31/14 1805  WBC 6.1  NEUTROABS 4.7  HGB 12.9  HCT 39.8  MCV 93.6  PLT 233   Cardiac Enzymes: No results found for this basename: CKTOTAL, CKMB, CKMBINDEX, TROPONINI,  in the last 168 hours  BNP (last 3 results) No results found for this basename: PROBNP,  in the last 8760 hours CBG: No results found for this basename: GLUCAP,  in the last 168  hours  Radiological Exams on Admission: Dg Hip Complete Left  05/31/2014   CLINICAL DATA:  History of trauma from a fall.  Left-sided hip pain.  EXAM: LEFT HIP - COMPLETE 2+ VIEW  COMPARISON:  No priors.  FINDINGS: Four views of the bony pelvis and left hip demonstrate an acute displaced transcervical neck fracture. This appears mildly impacted, with slight posterior displacement of the distal fracture fragment. The femoral head remains located in the acetabulum. Remaining portions of the bony pelvis and the visualized right proximal femur appear intact. Status post right hip hemiarthroplasty with a bipolar prosthesis.  IMPRESSION: 1. Acute displaced mildly impacted transcervical neck fracture of the left proximal femur.   Electronically Signed   By: Trudie Reed M.D.   On: 05/31/2014 18:49      EKG: Independently reviewed. Poor tracing, ? Diffuse Artifact, But P waves can be seen,  So sinus Rhythm without acute S-T changes.        Assessment/Plan:   80 y.o. female with  Principal Problem:    Closed left hip fracture Active Problems:   HYPERLIPIDEMIA   HYPERTENSION   GAIT IMBALANCE   Atrial fibrillation and flutter   1.   Closed Left Hip Fracture-    Ortho Dr Nilsa [facility] to see,  Surgery tonight.  NPO.   Pain Control with IV Dilaudid.   IVFs for maintenance.    2.   Atrial Fibrillation -   Lopressor IV while NPO.  On Metoprolol at home for HTN and rate control.   Not on oral thrombolytics due to Fall Risk.  On ASA Rx at home.     3.   HTN-  On Amlodipine, Metoprolol, Furosemide and Lotensin at home, Now NPO for Surgery, IV Lopressor q 6 hours and PRN IV Hydralazine if SBP > 180.      4.   Hyperlipidemia- history but on no meds.     5.   Gait Imbalance-  Chronic, High Fall risk.    6.   SCDs for DVT prophylaxis.        Code Status:  FULL CODE  Family Communication:    Family at Bedside Disposition Plan:    Inpatient  Time spent:  75 Minutes  Keniel Ralston Velora Heckler [facility] Pager [phone]  If 7PM-7AM, please contact night-coverage [email or web address] Password [email or web address] 05/31/2014, 8:30 PM

## 2014-05-31 NOTE — ED Notes (Signed)
Pt BIB family. Pt states she fell while in the bathroom on Memorial Day. Pt states she has pain in her L hip after falling. Pt denies pain elsewhere. Pt states she has been unable to bear wt on L leg or ambulate. When pt placed in gurney, external rotation and shortening of L leg noted. Pt alert, no acute distress. Skin warm and dry.

## 2014-05-31 NOTE — Anesthesia Preprocedure Evaluation (Signed)
Anesthesia Evaluation  Patient identified by MRN, date of birth, ID band Patient awake    Reviewed: Allergy & Precautions, H&P , NPO status , Patient's Chart, lab work & pertinent test results, reviewed documented beta blocker date and time   History of Anesthesia Complications Negative for: history of anesthetic complications  Airway Mallampati: II TM Distance: >3 FB Neck ROM: full    Dental  (+) Poor Dentition, Missing, Chipped   Pulmonary neg pulmonary ROS, former smoker,  breath sounds clear to auscultation  Pulmonary exam normal       Cardiovascular hypertension, On Home Beta Blockers and On Medications Atrial Fibrillation Rhythm:regular Rate:Normal  hyperlipidemia   Neuro/Psych Anxiety Gait imbalance CVA (2-3 years ago), No Residual Symptoms    GI/Hepatic Neg liver ROS, GERD-  Medicated,  Endo/Other  negative endocrine ROS  Renal/GU negative Renal ROS  negative genitourinary   Musculoskeletal  (+) Arthritis -,   Abdominal   Peds  Hematology negative hematology ROS (+)   Anesthesia Other Findings Fell two weeks ago - hip fracture NPO since 7 am  Reproductive/Obstetrics negative OB ROS                           Anesthesia Physical Anesthesia Plan  ASA: III  Anesthesia Plan: General ETT   Post-op Pain Management:    Induction:   Airway Management Planned:   Additional Equipment:   Intra-op Plan:   Post-operative Plan:   Informed Consent: I have reviewed the patients History and Physical, chart, labs and discussed the procedure including the risks, benefits and alternatives for the proposed anesthesia with the patient or authorized representative who has indicated his/her understanding and acceptance.   Dental Advisory Given  Plan Discussed with: CRNA and Surgeon  Anesthesia Plan Comments:         Anesthesia Quick Evaluation

## 2014-05-31 NOTE — Progress Notes (Signed)
Pre-visit discussion using our clinic review tool. No additional management support is needed unless otherwise documented below in the visit note.  

## 2014-05-31 NOTE — Progress Notes (Signed)
Subjective:    Patient ID: Cynthia Graham, female    DOB: 02/28/1917, 80 y.o.   MRN: [id number]  HPI  80 year old patient who has a history of hypertension, gait instability, and osteoarthritis.  She also has a history of atrial fibrillation, but not on anticoagulation due to her high fall risk, and history of GI hemorrhage.  10 or 11 days ago.  She had a mechanical fall, sustaining trauma and pain to the left hip area.  She noted immediate pain and has been nonambulatory since this fall.  She fell she sustained a severe bruise, but has not improved.  She is unable to bear weight and any flexion of the left hip elicits pain  Past Medical History  Diagnosis Date  . ANXIETY 05/17/2008  . ATRIAL FIBRILLATION WITH RAPID VENTRICULAR RESPONSE 10/09/2009  . CALLUS, RIGHT FOOT 02/11/2009  . EPISTAXIS, RECURRENT 01/15/2010  . GAIT IMBALANCE 08/16/2008  . GERD 08/08/2007  . HYPERLIPIDEMIA 05/17/2008  . HYPERTENSION 08/08/2007  . OSTEOARTHRITIS 05/17/2008  . SOB 08/18/2007  . WEAKNESS 05/17/2008  . DJD (degenerative joint disease)   . History of hiatal hernia   . Stroke     History   Social History  . Marital Status: Married    Spouse Name: N/A    Number of Children: N/A  . Years of Education: N/A   Occupational History  . Not on file.   Social History Main Topics  . Smoking status: Former Smoker    Types: Cigarettes    Quit date: 12/27/1968  . Smokeless tobacco: Never Used  . Alcohol Use: No  . Drug Use: No  . Sexual Activity: No   Other Topics Concern  . Not on file   Social History Narrative  . No narrative on file    Past Surgical History  Procedure Laterality Date  . Abdominal hysterectomy    . Esophagogastroduodenoscopy      stricture, hiatal hernia ,reflux esopg.  . Right hip fracture  October 2009    Dr Charlann Boxerlin  . Colonoscopy N/A 06/12/2013    Procedure: COLONOSCOPY;  Surgeon: Meryl DareMalcolm T Stark, MD;  Location: [facility];  Service: Endoscopy;  Laterality: N/A;    Family  History  Problem Relation Age of Onset  . Hypertension Neg Hx     family    No Known Allergies  Current Outpatient Prescriptions on File Prior to Visit  Medication Sig Dispense Refill  . albuterol (PROVENTIL HFA;VENTOLIN HFA) 108 (90 BASE) MCG/ACT inhaler Inhale 2 puffs into the lungs every 6 (six) hours as needed. wheezing      . amLODipine (NORVASC) 5 MG tablet Take 1 tablet (5 mg total) by mouth every morning.  90 tablet  1  . aspirin 81 MG tablet Take 1 tablet (81 mg total) by mouth daily.  30 tablet    . benazepril (LOTENSIN) 40 MG tablet Take 1 tablet (40 mg total) by mouth every morning.  90 tablet  1  . lansoprazole (PREVACID) 30 MG capsule Take 1 capsule (30 mg total) by mouth daily as needed. heartburn  90 capsule  1  . LORazepam (ATIVAN) 0.5 MG tablet Take 1 tablet (0.5 mg total) by mouth every 8 (eight) hours as needed for anxiety.  60 tablet  5  . metoprolol (LOPRESSOR) 100 MG tablet Take 1 tablet (100 mg total) by mouth 2 (two) times daily.  180 tablet  1  . Multiple Vitamin (MULTIVITAMIN WITH MINERALS) TABS Take 1 tablet by mouth daily.      .Marland Kitchen  polyethylene glycol powder (GLYCOLAX/MIRALAX) powder Take 17 g by mouth 2 (two) times daily as needed.  3350 g  1  . traMADol (ULTRAM) 50 MG tablet Take 1 tablet (50 mg total) by mouth every 8 (eight) hours as needed.  30 tablet  0   No current facility-administered medications on file prior to visit.    BP 140/80  Pulse 64  Temp(Src) 97.5 F (36.4 C) (Oral)  Resp 18  SpO2 97%       Review of Systems  Constitutional: Negative.   HENT: Negative for congestion, dental problem, hearing loss, rhinorrhea, sinus pressure, sore throat and tinnitus.   Eyes: Negative for pain, discharge and visual disturbance.  Respiratory: Negative for cough and shortness of breath.   Cardiovascular: Negative for chest pain, palpitations and leg swelling.  Gastrointestinal: Negative for nausea, vomiting, abdominal pain, diarrhea, constipation,  blood in stool and abdominal distention.  Genitourinary: Negative for dysuria, urgency, frequency, hematuria, flank pain, vaginal bleeding, vaginal discharge, difficulty urinating, vaginal pain and pelvic pain.  Musculoskeletal: Positive for gait problem. Negative for arthralgias and joint swelling.  Skin: Negative for rash.  Neurological: Negative for dizziness, syncope, speech difficulty, weakness, numbness and headaches.  Hematological: Negative for adenopathy.  Psychiatric/Behavioral: Negative for behavioral problems, dysphoric mood and agitation. The patient is not nervous/anxious.   All other systems reviewed and are negative.      Objective:   Physical Exam  Constitutional: She appears well-developed and well-nourished. No distress.  Wheelchair-bound Alert Normal blood pressure  Musculoskeletal:  Unable to flex left leg due to pain; passive flexing was quite painful          Assessment & Plan:   Left hip pain.  Patient was advised to report to the ED immediately for radiographic evaluation for likely left hip fracture and/or pelvic fracture Hypertension stable Gait instability Atrial fibrillation

## 2014-05-31 NOTE — Consult Note (Signed)
Reason for Consult:left femoral neck fracture Referring Physician: EDP  Cynthia Graham is an 80 y.o. female.  HPI: 80 yo female who fell two weeks ago injuring her left hip.  She has been unable to bear weight on the left hip/leg.  Family has been carrying her around the house.  She is here for persistent pain an inability to ambulate.  Past Medical History  Diagnosis Date  . ANXIETY 05/17/2008  . ATRIAL FIBRILLATION WITH RAPID VENTRICULAR RESPONSE 10/09/2009  . CALLUS, RIGHT FOOT 02/11/2009  . EPISTAXIS, RECURRENT 01/15/2010  . GAIT IMBALANCE 08/16/2008  . GERD 08/08/2007  . HYPERLIPIDEMIA 05/17/2008  . HYPERTENSION 08/08/2007  . OSTEOARTHRITIS 05/17/2008  . SOB 08/18/2007  . WEAKNESS 05/17/2008  . DJD (degenerative joint disease)   . History of hiatal hernia   . Stroke     Past Surgical History  Procedure Laterality Date  . Abdominal hysterectomy    . Esophagogastroduodenoscopy      stricture, hiatal hernia ,reflux esopg.  . Right hip fracture  October 2009    Dr Alvan Dame  . Colonoscopy N/A 06/12/2013    Procedure: COLONOSCOPY;  Surgeon: Ladene Artist, MD;  Location: [facility];  Service: Endoscopy;  Laterality: N/A;    Family History  Problem Relation Age of Onset  . Hypertension Neg Hx     family    Social History:  reports that she quit smoking about 45 years ago. Her smoking use included Cigarettes. She smoked 0.00 packs per day. She has never used smokeless tobacco. She reports that she does not drink alcohol or use illicit drugs.  Allergies: No Known Allergies  Medications: I have reviewed the patient's current medications.  Results for orders placed during the hospital encounter of 05/31/14 (from the past 48 hour(s))  BASIC METABOLIC PANEL     Status: Abnormal   Collection Time    05/31/14  6:05 PM      Result Value Ref Range   Sodium 141  137 - 147 mEq/L   Potassium 5.1  3.7 - 5.3 mEq/L   Comment: MODERATE HEMOLYSIS     HEMOLYSIS AT THIS LEVEL MAY AFFECT RESULT   Chloride 101  96 - 112 mEq/L   CO2 23  19 - 32 mEq/L   Glucose, Bld 77  70 - 99 mg/dL   BUN 20  6 - 23 mg/dL   Creatinine, Ser 0.63  0.50 - 1.10 mg/dL   Calcium 9.2  8.4 - 10.5 mg/dL   GFR calc non Af Amer 74 (*) >90 mL/min   GFR calc Af Amer 86 (*) >90 mL/min   Comment: (NOTE)     The eGFR has been calculated using the CKD EPI equation.     This calculation has not been validated in all clinical situations.     eGFR's persistently <90 mL/min signify possible Chronic Kidney     Disease.  CBC WITH DIFFERENTIAL     Status: None   Collection Time    05/31/14  6:05 PM      Result Value Ref Range   WBC 6.1  4.0 - 10.5 K/uL   RBC 4.25  3.87 - 5.11 MIL/uL   Hemoglobin 12.9  12.0 - 15.0 g/dL   HCT 39.8  36.0 - 46.0 %   MCV 93.6  78.0 - 100.0 fL   MCH 30.4  26.0 - 34.0 pg   MCHC 32.4  30.0 - 36.0 g/dL   RDW 15.5  11.5 - 15.5 %   Platelets  233  150 - 400 K/uL   Neutrophils Relative % 77  43 - 77 %   Neutro Abs 4.7  1.7 - 7.7 K/uL   Lymphocytes Relative 12  12 - 46 %   Lymphs Abs 0.7  0.7 - 4.0 K/uL   Monocytes Relative 10  3 - 12 %   Monocytes Absolute 0.6  0.1 - 1.0 K/uL   Eosinophils Relative 1  0 - 5 %   Eosinophils Absolute 0.1  0.0 - 0.7 K/uL   Basophils Relative 0  0 - 1 %   Basophils Absolute 0.0  0.0 - 0.1 K/uL  PROTIME-INR     Status: None   Collection Time    05/31/14  6:05 PM      Result Value Ref Range   Prothrombin Time 14.5  11.6 - 15.2 seconds   INR 1.15  0.00 - 1.49  TYPE AND SCREEN     Status: None   Collection Time    05/31/14  6:05 PM      Result Value Ref Range   ABO/RH(D) O POS     Antibody Screen NEG     Sample Expiration 06/03/2014      Dg Hip Complete Left  05/31/2014   CLINICAL DATA:  History of trauma from a fall.  Left-sided hip pain.  EXAM: LEFT HIP - COMPLETE 2+ VIEW  COMPARISON:  No priors.  FINDINGS: Four views of the bony pelvis and left hip demonstrate an acute displaced transcervical neck fracture. This appears mildly impacted, with slight  posterior displacement of the distal fracture fragment. The femoral head remains located in the acetabulum. Remaining portions of the bony pelvis and the visualized right proximal femur appear intact. Status post right hip hemiarthroplasty with a bipolar prosthesis.  IMPRESSION: 1. Acute displaced mildly impacted transcervical neck fracture of the left proximal femur.   Electronically Signed   By: Vinnie Langton M.D.   On: 05/31/2014 18:49    ROS Blood pressure 166/74, pulse 76, temperature 97.7 F (36.5 C), temperature source Oral, resp. rate 16, SpO2 97.00%. Physical Exam  AAO, neck nontender, bilateral shouders with normal ROM and no tenderness, NVI bilateral UEs, left LE shortened and flexed, NVI Right hip with no pain with AROM, NVI  Assessment/Plan: Displaced femoral neck fracture on the left Plan left hip hemiarthroplasty tonight.  Medical clearance pending. Family in agreement with the plan.  [name] 05/31/2014, 8:11 PM

## 2014-05-31 NOTE — ED Notes (Signed)
Bedside report received from previous RN Chere.

## 2014-06-01 ENCOUNTER — Inpatient Hospital Stay (HOSPITAL_COMMUNITY): Payer: Medicare Other

## 2014-06-01 DIAGNOSIS — E875 Hyperkalemia: Secondary | ICD-10-CM

## 2014-06-01 DIAGNOSIS — S72002A Fracture of unspecified part of neck of left femur, initial encounter for closed fracture: Secondary | ICD-10-CM | POA: Diagnosis present

## 2014-06-01 LAB — CBC
HEMATOCRIT: 30.1 % — AB (ref 36.0–46.0)
Hemoglobin: 9.7 g/dL — ABNORMAL LOW (ref 12.0–15.0)
MCH: 30.5 pg (ref 26.0–34.0)
MCHC: 31.9 g/dL (ref 30.0–36.0)
MCV: 95.6 fL (ref 78.0–100.0)
Platelets: 199 10*3/uL (ref 150–400)
RBC: 3.15 MIL/uL — ABNORMAL LOW (ref 3.87–5.11)
RDW: 15.5 % (ref 11.5–15.5)
WBC: 9.4 10*3/uL (ref 4.0–10.5)

## 2014-06-01 LAB — BASIC METABOLIC PANEL
BUN: 18 mg/dL (ref 6–23)
CHLORIDE: 106 meq/L (ref 96–112)
CO2: 23 mEq/L (ref 19–32)
CREATININE: 0.68 mg/dL (ref 0.50–1.10)
Calcium: 8.3 mg/dL — ABNORMAL LOW (ref 8.4–10.5)
GFR calc Af Amer: 84 mL/min — ABNORMAL LOW (ref >90)
GFR calc non Af Amer: 72 mL/min — ABNORMAL LOW (ref >90)
GLUCOSE: 84 mg/dL (ref 70–99)
POTASSIUM: 5.5 meq/L — AB (ref 3.7–5.3)
Sodium: 143 mEq/L (ref 137–147)

## 2014-06-01 LAB — SURGICAL PCR SCREEN
MRSA, PCR: NEGATIVE
Staphylococcus aureus: NEGATIVE

## 2014-06-01 MED ORDER — LACTATED RINGERS IV SOLN: INTRAVENOUS | Status: DC

## 2014-06-01 MED ORDER — BENAZEPRIL HCL 40 MG PO TABS
40.0000 mg | ORAL_TABLET | Freq: Every morning | ORAL | Status: DC
  Administered 2014-06-01 – 2014-06-03 (×3): 40 mg via ORAL
  Filled 2014-06-01 (×3): qty 1

## 2014-06-01 MED ORDER — ONDANSETRON HCL 4 MG/2ML IJ SOLN: 4.0000 mg | Freq: Three times a day (TID) | INTRAMUSCULAR | Status: AC | PRN

## 2014-06-01 MED ORDER — ONDANSETRON HCL 4 MG PO TABS: 4.0000 mg | ORAL_TABLET | Freq: Four times a day (QID) | ORAL | Status: DC | PRN

## 2014-06-01 MED ORDER — ACETAMINOPHEN 325 MG PO TABS: 650.0000 mg | ORAL_TABLET | Freq: Four times a day (QID) | ORAL | Status: DC | PRN

## 2014-06-01 MED ORDER — ACETAMINOPHEN 650 MG RE SUPP: 650.0000 mg | Freq: Four times a day (QID) | RECTAL | Status: DC | PRN

## 2014-06-01 MED ORDER — HYDROCODONE-ACETAMINOPHEN 5-325 MG PO TABS
1.0000 | ORAL_TABLET | ORAL | Status: AC | PRN
  Administered 2014-06-01 (×2): 1 via ORAL
  Filled 2014-06-01: qty 2
  Filled 2014-06-01 (×2): qty 1

## 2014-06-01 MED ORDER — SODIUM CHLORIDE 0.9 % IV SOLN: INTRAVENOUS | Status: DC

## 2014-06-01 MED ORDER — HYDROCODONE-ACETAMINOPHEN 5-325 MG PO TABS
1.0000 | ORAL_TABLET | Freq: Four times a day (QID) | ORAL | Status: DC | PRN
  Administered 2014-06-01 (×3): 1 via ORAL
  Administered 2014-06-02: 2 via ORAL
  Administered 2014-06-02 – 2014-06-03 (×2): 1 via ORAL
  Filled 2014-06-01 (×2): qty 1
  Filled 2014-06-01: qty 2
  Filled 2014-06-01 (×2): qty 1

## 2014-06-01 MED ORDER — ONDANSETRON HCL 4 MG/2ML IJ SOLN: 4.0000 mg | Freq: Once | INTRAMUSCULAR | Status: DC | PRN

## 2014-06-01 MED ORDER — ASPIRIN EC 81 MG PO TBEC
81.0000 mg | DELAYED_RELEASE_TABLET | Freq: Every morning | ORAL | Status: DC
  Administered 2014-06-01 – 2014-06-03 (×3): 81 mg via ORAL
  Filled 2014-06-01 (×3): qty 1

## 2014-06-01 MED ORDER — MEPERIDINE HCL 50 MG/ML IJ SOLN: 6.2500 mg | INTRAMUSCULAR | Status: DC | PRN

## 2014-06-01 MED ORDER — MORPHINE SULFATE 2 MG/ML IJ SOLN: 0.5000 mg | INTRAMUSCULAR | Status: DC | PRN

## 2014-06-01 MED ORDER — ONDANSETRON HCL 4 MG/2ML IJ SOLN: 4.0000 mg | Freq: Four times a day (QID) | INTRAMUSCULAR | Status: DC | PRN

## 2014-06-01 MED ORDER — ACETAMINOPHEN 10 MG/ML IV SOLN: 1000.0000 mg | Freq: Once | INTRAVENOUS | Status: DC

## 2014-06-01 MED ORDER — METOPROLOL TARTRATE 100 MG PO TABS
100.0000 mg | ORAL_TABLET | Freq: Two times a day (BID) | ORAL | Status: DC
  Filled 2014-06-01 (×3): qty 1

## 2014-06-01 MED ORDER — ADULT MULTIVITAMIN W/MINERALS CH
1.0000 | ORAL_TABLET | Freq: Every morning | ORAL | Status: DC
  Administered 2014-06-01 – 2014-06-02 (×2): 1 via ORAL
  Filled 2014-06-01 (×3): qty 1

## 2014-06-01 MED ORDER — CEFAZOLIN SODIUM-DEXTROSE 2-3 GM-% IV SOLR
2.0000 g | Freq: Four times a day (QID) | INTRAVENOUS | Status: AC
  Administered 2014-06-01 (×2): 2 g via INTRAVENOUS
  Filled 2014-06-01 (×2): qty 50

## 2014-06-01 MED ORDER — ALBUTEROL SULFATE (2.5 MG/3ML) 0.083% IN NEBU: 3.0000 mL | INHALATION_SOLUTION | Freq: Four times a day (QID) | RESPIRATORY_TRACT | Status: DC | PRN

## 2014-06-01 MED ORDER — SODIUM CHLORIDE 0.9 % IV SOLN
INTRAVENOUS | Status: DC
  Administered 2014-06-01: 10:00:00 via INTRAVENOUS

## 2014-06-01 MED ORDER — VITAMINS A & D EX OINT
TOPICAL_OINTMENT | CUTANEOUS | Status: AC
  Administered 2014-06-01: 10:00:00
  Filled 2014-06-01: qty 5

## 2014-06-01 MED ORDER — AMLODIPINE BESYLATE 5 MG PO TABS
5.0000 mg | ORAL_TABLET | Freq: Every morning | ORAL | Status: DC
  Administered 2014-06-01 – 2014-06-03 (×3): 5 mg via ORAL
  Filled 2014-06-01 (×3): qty 1

## 2014-06-01 MED ORDER — OXYCODONE HCL 5 MG PO TABS
5.0000 mg | ORAL_TABLET | ORAL | Status: DC | PRN
  Administered 2014-06-03 (×2): 5 mg via ORAL
  Filled 2014-06-01 (×2): qty 1

## 2014-06-01 MED ORDER — FENTANYL CITRATE 0.05 MG/ML IJ SOLN: 25.0000 ug | INTRAMUSCULAR | Status: DC | PRN

## 2014-06-01 MED ORDER — CEFAZOLIN SODIUM-DEXTROSE 2-3 GM-% IV SOLR: 2.0000 g | INTRAVENOUS | Status: DC

## 2014-06-01 MED ORDER — PANTOPRAZOLE SODIUM 40 MG PO TBEC
40.0000 mg | DELAYED_RELEASE_TABLET | Freq: Every day | ORAL | Status: DC
  Administered 2014-06-01 – 2014-06-03 (×3): 40 mg via ORAL
  Filled 2014-06-01 (×3): qty 1

## 2014-06-01 MED ORDER — METOCLOPRAMIDE HCL 10 MG PO TABS: 5.0000 mg | ORAL_TABLET | Freq: Three times a day (TID) | ORAL | Status: DC | PRN

## 2014-06-01 MED ORDER — POLYETHYLENE GLYCOL 3350 17 G PO PACK
17.0000 g | PACK | Freq: Two times a day (BID) | ORAL | Status: DC | PRN
  Filled 2014-06-01: qty 1

## 2014-06-01 MED ORDER — METOPROLOL TARTRATE 50 MG PO TABS
50.0000 mg | ORAL_TABLET | Freq: Two times a day (BID) | ORAL | Status: DC
  Administered 2014-06-01 – 2014-06-03 (×4): 50 mg via ORAL
  Filled 2014-06-01 (×6): qty 1

## 2014-06-01 MED ORDER — HYDROMORPHONE HCL PF 1 MG/ML IJ SOLN: 0.5000 mg | INTRAMUSCULAR | Status: DC | PRN

## 2014-06-01 MED ORDER — SODIUM CHLORIDE 0.9 % IJ SOLN
3.0000 mL | Freq: Two times a day (BID) | INTRAMUSCULAR | Status: DC
  Administered 2014-06-01 – 2014-06-02 (×3): 3 mL via INTRAVENOUS

## 2014-06-01 MED ORDER — ENOXAPARIN SODIUM 40 MG/0.4ML ~~LOC~~ SOLN: 40.0000 mg | SUBCUTANEOUS | Status: AC

## 2014-06-01 MED ORDER — FUROSEMIDE 20 MG PO TABS
20.0000 mg | ORAL_TABLET | Freq: Every morning | ORAL | Status: DC
  Administered 2014-06-01 – 2014-06-03 (×3): 20 mg via ORAL
  Filled 2014-06-01 (×3): qty 1

## 2014-06-01 MED ORDER — PHENOL 1.4 % MT LIQD: 1.0000 | OROMUCOSAL | Status: DC | PRN

## 2014-06-01 MED ORDER — SODIUM CHLORIDE 0.9 % IV SOLN
INTRAVENOUS | Status: DC
  Administered 2014-06-02 (×2): via INTRAVENOUS

## 2014-06-01 MED ORDER — CHLORHEXIDINE GLUCONATE 4 % EX LIQD
60.0000 mL | Freq: Once | CUTANEOUS | Status: DC
  Filled 2014-06-01: qty 60

## 2014-06-01 MED ORDER — POTASSIUM CHLORIDE IN NACL 20-0.45 MEQ/L-% IV SOLN
INTRAVENOUS | Status: DC
  Administered 2014-06-01: 02:00:00 via INTRAVENOUS
  Filled 2014-06-01: qty 1000

## 2014-06-01 MED ORDER — METOCLOPRAMIDE HCL 5 MG/ML IJ SOLN: 5.0000 mg | Freq: Three times a day (TID) | INTRAMUSCULAR | Status: DC | PRN

## 2014-06-01 MED ORDER — MENTHOL 3 MG MT LOZG: 1.0000 | LOZENGE | OROMUCOSAL | Status: DC | PRN

## 2014-06-01 MED ORDER — ENOXAPARIN SODIUM 40 MG/0.4ML ~~LOC~~ SOLN
40.0000 mg | Freq: Every day | SUBCUTANEOUS | Status: DC
  Administered 2014-06-01 – 2014-06-02 (×2): 40 mg via SUBCUTANEOUS
  Filled 2014-06-01 (×2): qty 0.4

## 2014-06-01 MED ORDER — LORAZEPAM 0.5 MG PO TABS
0.5000 mg | ORAL_TABLET | Freq: Three times a day (TID) | ORAL | Status: DC | PRN
  Administered 2014-06-02: 0.5 mg via ORAL
  Filled 2014-06-01: qty 1

## 2014-06-01 NOTE — Progress Notes (Signed)
Pt converted back to Afib, HR ranges 55 w/sleep to 70 when awake. Asymptomatic will continue to monitor and alert MD

## 2014-06-01 NOTE — Anesthesia Postprocedure Evaluation (Signed)
  Anesthesia Post-op Note  Anesthesia Post Note  Patient: Cynthia Graham  Procedure(s) Performed: Procedure(s) (LRB): ARTHROPLASTY BIPOLAR HIP (Left)  Anesthesia type: General  Patient location: PACU  Post pain: Pain level controlled  Post assessment: Post-op Vital signs reviewed  Last Vitals:  Filed Vitals:   06/01/14 0107  BP: 144/60  Pulse: 61  Temp: 36.4 C  Resp: 15    Post vital signs: Reviewed  Level of consciousness: sedated  Complications: No apparent anesthesia complications

## 2014-06-01 NOTE — Progress Notes (Signed)
PT Cancellation Note  Patient Details Name: NARUMI FEWELL MRN: [id number] DOB: 02-10-1917   Cancelled Treatment:    Reason Eval/Treat Not Completed: Other (comment) (PT order states to start tomorrow.) Will see pt then.    Alvester Morin 06/01/2014, 9:19 AM [phone]

## 2014-06-01 NOTE — Progress Notes (Signed)
Kcl was 5.5 pt taking po, swithed IVF to NS @50  per order

## 2014-06-01 NOTE — Brief Op Note (Signed)
05/31/2014 - 06/01/2014  12:16 AM  PATIENT:  Cynthia Graham  80 y.o. female  PRE-OPERATIVE DIAGNOSIS:  fracture femoral neck left, displaced  POST-OPERATIVE DIAGNOSIS:  fracture femoral neck left, displaced  PROCEDURE:  Procedure(s): ARTHROPLASTY BIPOLAR HIP (Left), monopolar, [place] [employer], cemented  SURGEON:  Surgeon(s) and Role:    * Verlee Rossetti, MD - Primary  PHYSICIAN ASSISTANT:   ASSISTANTS: Irish Elders, PA-C   ANESTHESIA:   general  EBL:  Total I/O In: 1000 [I.V.:1000] Out: 350 [Urine:150; Blood:200]  BLOOD ADMINISTERED:none  DRAINS: none   LOCAL MEDICATIONS USED:  NONE  SPECIMEN:  No Specimen  DISPOSITION OF SPECIMEN:  N/A  COUNTS:  YES  TOURNIQUET:  * No tourniquets in log *  DICTATION: .Other Dictation: Dictation Number [id number]  PLAN OF CARE: Admit to inpatient   PATIENT DISPOSITION:  PACU - hemodynamically stable.   Delay start of Pharmacological VTE agent (>24hrs) due to surgical blood loss or risk of bleeding: no

## 2014-06-01 NOTE — Progress Notes (Addendum)
[facility] PROGRESS NOTE  Cynthia Graham RCV:[id number] DOB: September 11, 1917 DOA: 05/31/2014 PCP: Rogelia Boga, MD  Assessment/Plan: Left Hip Fracture -S/p surgical repair 6/5. -Management as per ortho.  A Fib -Not on anticoagulation given fall risk. -Will reduce metoprolol dose from 100 BID to 50 mg BID given episodes of bradycardia.  HTN -Fair control. -Watch with reduction in metoprolol dose.  Hyperkalemia -Have reduced supplemental K from IVF. -Recheck in am.  Code Status: Full Code Family Communication: Daughter [name] at bedside updated on plan of cre.  Disposition Plan: Await PT/OT evals. Likely SNF.   Consultants:  Ortho   Antibiotics:  None   Subjective: No complaints other than mild left hip pain.  Objective: Filed Vitals:   06/01/14 0230 06/01/14 0400 06/01/14 0500 06/01/14 0800  BP: 128/44  130/55   Pulse: 51  65   Temp: 98 F (36.7 C)  98.7 F (37.1 C)   TempSrc: Axillary  Oral   Resp: 14 16 16 16   Height:      Weight:      SpO2: 99% 95% 99% 98%    Intake/Output Summary (Last 24 hours) at 06/01/14 1139 Last data filed at 06/01/14 0900  Gross per 24 hour  Intake   1775 ml  Output    450 ml  Net   1325 ml   Filed Weights   06/01/14 0107  Weight: 45 kg (99 lb 3.3 oz)    Exam:   General:  AA Ox3  Cardiovascular: RRR  Respiratory: CTA B  Abdomen: S/NT/ND/+BS  Extremities: trace edema   Neurologic:  Non-focal.  Data Reviewed: Basic Metabolic Panel:  Recent Labs Lab 05/31/14 1805 06/01/14 0530  NA 141 143  K 5.1 5.5*  CL 101 106  CO2 23 23  GLUCOSE 77 84  BUN 20 18  CREATININE 0.63 0.68  CALCIUM 9.2 8.3*   Liver Function Tests: No results found for this basename: AST, ALT, ALKPHOS, BILITOT, PROT, ALBUMIN,  in the last 168 hours No results found for this basename: LIPASE, AMYLASE,  in the last 168 hours No results found for this basename: AMMONIA,  in the last 168 hours CBC:  Recent Labs Lab  05/31/14 1805 06/01/14 0530  WBC 6.1 9.4  NEUTROABS 4.7  --   HGB 12.9 9.7*  HCT 39.8 30.1*  MCV 93.6 95.6  PLT 233 199   Cardiac Enzymes: No results found for this basename: CKTOTAL, CKMB, CKMBINDEX, TROPONINI,  in the last 168 hours BNP (last 3 results) No results found for this basename: PROBNP,  in the last 8760 hours CBG: No results found for this basename: GLUCAP,  in the last 168 hours  Recent Results (from the past 240 hour(s))  SURGICAL PCR SCREEN     Status: None   Collection Time    05/31/14  9:20 PM      Result Value Ref Range Status   MRSA, PCR NEGATIVE  NEGATIVE Final   Staphylococcus aureus NEGATIVE  NEGATIVE Final   Comment:            The Xpert SA Assay (FDA     approved for NASAL specimens     in patients over 77 years of age),     is one component of     a comprehensive surveillance     program.  Test performance has     been validated by [employer] for patients greater     than or equal to  78 year old.     It is not intended     to diagnose infection nor to     guide or monitor treatment.     Studies: Dg Hip Complete Left  05/31/2014   CLINICAL DATA:  History of trauma from a fall.  Left-sided hip pain.  EXAM: LEFT HIP - COMPLETE 2+ VIEW  COMPARISON:  No priors.  FINDINGS: Four views of the bony pelvis and left hip demonstrate an acute displaced transcervical neck fracture. This appears mildly impacted, with slight posterior displacement of the distal fracture fragment. The femoral head remains located in the acetabulum. Remaining portions of the bony pelvis and the visualized right proximal femur appear intact. Status post right hip hemiarthroplasty with a bipolar prosthesis.  IMPRESSION: 1. Acute displaced mildly impacted transcervical neck fracture of the left proximal femur.   Electronically Signed   By: Trudie Reedaniel  Entrikin M.D.   On: 05/31/2014 18:49   Dg Pelvis Portable  06/01/2014   CLINICAL DATA:  Postoperative from right hip arthroplasty  EXAM:  PORTABLE PELVIS 1-2 VIEWS  COMPARISON:  AP pelvis of October 27, 2008  FINDINGS: The patient has undergone left hip arthroplasty. Radiographic positioning of the prosthetic components is good. A previously placed right hip arthroplasty is unchanged.  IMPRESSION: The patient has undergone left hip arthroplasty without evidence of immediate postprocedure complication.   Electronically Signed   By: David  SwazilandJordan   On: 06/01/2014 01:15   Dg Chest Port 1 View  06/01/2014   CLINICAL DATA:  Total hip arthroplasty.  Chest pain.  EXAM: PORTABLE CHEST - 1 VIEW  COMPARISON:  10/17/2012.  FINDINGS: The cardiac silhouette, mediastinal and hilar contours are within normal limits and stable. There is tortuosity and calcification of the thoracic aorta. The lungs demonstrate chronic bronchitic and emphysematous changes but no definite acute overlying pulmonary process. The bony thorax is intact.  IMPRESSION: Chronic lung changes but no acute pulmonary findings.   Electronically Signed   By: Loralie ChampagneMark  Gallerani M.D.   On: 06/01/2014 06:18    Scheduled Meds: . amLODipine  5 mg Oral q morning - 10a  . aspirin EC  81 mg Oral q morning - 10a  . benazepril  40 mg Oral q morning - 10a  . chlorhexidine  60 mL Topical Once  . enoxaparin (LOVENOX) injection  40 mg Subcutaneous Daily  . furosemide  20 mg Oral q morning - 10a  . metoprolol  50 mg Oral BID  . multivitamin with minerals  1 tablet Oral q morning - 10a  . pantoprazole  40 mg Oral Daily  . sodium chloride  3 mL Intravenous Q12H   Continuous Infusions: . sodium chloride 50 mL/hr at 06/01/14 0958  . sodium chloride    . sodium chloride      Principal Problem:   Closed left hip fracture Active Problems:   HYPERLIPIDEMIA   HYPERTENSION   GAIT IMBALANCE   Atrial fibrillation and flutter   Fracture of femoral neck, left    Time spent: 35 minutes. Greater than 50% of this time was spent in direct contact with the patient coordinating care.    Cynthia Cloudstela Y  Hernandez Graham  [facility] Pager [phone]  If 7PM-7AM, please contact night-coverage at [email or web address], password [email or web address] 06/01/2014, 11:39 AM  LOS: 1 day

## 2014-06-01 NOTE — Progress Notes (Signed)
Subjective: 1 Day Post-Op Procedure(s) (LRB): ARTHROPLASTY BIPOLAR HIP (Left) Patient reports pain as mild to left hip.  Tolerating PO's. Family at bedside. No complications since surgery. Denies CP, SOB, palpitations, or calf pain.   Objective: Vital signs in last 24 hours: Temp:  [97.5 F (36.4 C)-98.7 F (37.1 C)] 98.7 F (37.1 C) (06/06 0500) Pulse Rate:  [51-119] 65 (06/06 0500) Resp:  [13-21] 16 (06/06 0800) BP: (128-183)/(44-80) 130/55 mmHg (06/06 0500) SpO2:  [84 %-100 %] 98 % (06/06 0800) Weight:  [45 kg (99 lb 3.3 oz)] 45 kg (99 lb 3.3 oz) (06/06 0107)  Intake/Output from previous day: 06/05 0701 - 06/06 0700 In: 1655 [I.V.:1605; IV Piggyback:50] Out: 450 [Urine:250; Blood:200] Intake/Output this shift:     Recent Labs  05/31/14 1805 06/01/14 0530  HGB 12.9 9.7*    Recent Labs  05/31/14 1805 06/01/14 0530  WBC 6.1 9.4  RBC 4.25 3.15*  HCT 39.8 30.1*  PLT 233 199    Recent Labs  05/31/14 1805 06/01/14 0530  NA 141 143  K 5.1 5.5*  CL 101 106  CO2 23 23  BUN 20 18  CREATININE 0.63 0.68  GLUCOSE 77 84  CALCIUM 9.2 8.3*    Recent Labs  05/31/14 1805  INR 1.15    Alert and oriented x3. Left hip dressing C/D/I. Tender to touch. Sensation and pedal pulse intact bilateral. Calves soft and non tender.  Assessment/Plan: 1 Day Post-Op Procedure(s) (LRB): ARTHROPLASTY BIPOLAR HIP (Left) PT tomorrow WBAT tomorrow LLE. Advance diet as tolerated. Social work consult for SNF or home at D/c. Planning for first of the week d/c. Conintue current care  Hyperkalemia: Medicine following. VSS   Mandi Mattioli L Yeila Morro 06/01/2014, 9:33 AM

## 2014-06-01 NOTE — Op Note (Signed)
NAMECHEENA, HOFMANN                  ACCOUNT NO.:  [id number]  MEDICAL RECORD NO.:  [id number]  LOCATION:  1410                         FACILITY:  [facility]  PHYSICIAN:  Almedia Balls. Ranell Patrick, M.D. DATE OF BIRTH:  February 20, 1917  DATE OF PROCEDURE:  05/31/2014 DATE OF DISCHARGE:  05/31/2014                              OPERATIVE REPORT   PREOPERATIVE DIAGNOSIS:  Displaced left femoral neck fracture.  POSTOPERATIVE DIAGNOSIS:  Displaced left femoral neck fracture.  PROCEDURE PERFORMED:  Left hip hemiarthroplasty, monopolar using DePuy Summit basic system, cemented.  ATTENDING SURGEON:  Almedia Balls. Ranell Patrick, MD.  ASSISTANT:  Lanna Poche, PA-C.  ANESTHESIA:  General anesthesia was used.  ESTIMATED BLOOD LOSS:  100 mL.  FLUID REPLACEMENT:  1000 mL crystalloid.  INSTRUMENT COUNTS:  Correct.  COMPLICATIONS:  There were no complications.  ANTIBIOTICS:  Perioperative antibiotics were given.  INDICATIONS:  Patient is a 80 year old, limited household ambulator who suffered a ground-level fall injuring her left hip.  The patient had a prior right hip hemiarthroplasty done by Dr. Durene Romans.  The patient presents unable to bear weight on her left hip.  This actually occurred several weeks ago, and she has been carried around by family members in the house, unable to bear weight for the last 2 weeks.  She presents, for persistent pain and inability to weight bear, to the emergency room. X-rays demonstrating a displaced femoral neck fracture, subacute.  I counseled the patient regarding options for management to include continued conservative management versus surgery.  Recommending surgery to improve mobility and comfort and informed consent was obtained.  DESCRIPTION OF PROCEDURE:  After adequate level of anesthesia achieved, the patient positioned in the right lateral decubitus position with the left hip up, sterilely prepped and draped all the down leg padded appropriately as well as an  axillary roll was utilized.  After a sterile prep and drape, we called a time-out.  We then entered the hip on the left using a standard Kocher-Langenbeck incision starting at the vastus ridge extending posteriorly along the gluteus maximus fibers course. Dissection carried down through subcutaneous tissues using the Bovie. We identified the tensor fascia lata divided that in line with the skin incision.  Identified the gluteus medius, and retracted that, released the short external rotators with progressive internal rotation of the posterior neck of the femur, medially encountered the fracture which was subacute.  No fresh hematoma was expressed.  We went ahead and noted that there was a fracture line extending all the way down to just above the lesser trochanter, so we do not have to do a cut down medially on the lesser, but out more laterally towards the greater trochanter.  We did have to freshened it up with an oscillating saw and an osteotome. We then went ahead and retracted the femur anteriorly, placed a corkscrew into the femoral head and retrieved that and sized to 45 mm head bone.  We then removed the ligamentum teres.  Inspected the remaining acetabulum which was in good shape.  We removed some extra comminuted fragments from the femoral neck and some capsule.  Next, we prepared the stem, used a cookie cutter, then a canal  reamer and lateralizer and then sequential broaching up to a size 3 Summit basic cemented stem.  We then placed our cement restrictor distal left, we sized to a size 4.  We thoroughly pulse irrigated and dried the canal and then cemented the Summit basic stem with appropriate anteversion approximately 25 degrees with DePuy high viscosity cement.  Once the cement was allowed to harden with a centralizer distally, we went ahead and placed a +5 neck adapter and a 45 monopolar head and were happy with the soft tissue balancing and stability.  Leg on leg and also  90-90 with no instability.  We thoroughly irrigated the wound.  We then closed our capsule with interrupted #1 Ethibond suture followed by repair of the piriformis to the greater trochanter/gluteus medius.  Next, tensor fascia lata repaired with interrupted 0 Vicryl suture and interrupted #1 Vicryl suture.  Subcu closure with 2-0 Vicryl and running 4-0 Monocryl for skin.  Steri-Strips applied followed by sterile dressing.  The patient tolerated the surgery well.     Almedia BallsSteven R. Ranell PatrickNorris, M.D.     SRN/MEDQ  D:  06/01/2014  T:  06/01/2014  Job:  [id number]

## 2014-06-01 NOTE — Progress Notes (Signed)
Converted back to NSR/NSB, held metoprolol for HR 58

## 2014-06-01 NOTE — Progress Notes (Signed)
OT Cancellation Note  Patient Details Name: Cynthia Graham MRN: [id number] DOB: September 27, 1917   Cancelled Treatment:    Reason Eval/Treat Not Completed: Other (comment)  Noted PT to start 6/7. Will check back tomorrow if schedule permits, or Monday  [facility] 06/01/2014, 7:23 AM Marica Otter, OTR/L [phone] 06/01/2014

## 2014-06-01 NOTE — Transfer of Care (Signed)
Immediate Anesthesia Transfer of Care Note  Patient: Cynthia Graham  Procedure(s) Performed: Procedure(s) (LRB): ARTHROPLASTY BIPOLAR HIP (Left)  Patient Location: PACU  Anesthesia Type: General  Level of Consciousness: sedated, patient cooperative and responds to stimulation  Airway & Oxygen Therapy: Patient Spontanous Breathing and Patient connected to face mask oxgen  Post-op Assessment: Report given to PACU RN and Post -op Vital signs reviewed and stable  Post vital signs: Reviewed and stable  Complications: No apparent anesthesia complications

## 2014-06-02 LAB — BASIC METABOLIC PANEL
BUN: 16 mg/dL (ref 6–23)
CHLORIDE: 102 meq/L (ref 96–112)
CO2: 25 mEq/L (ref 19–32)
CREATININE: 0.86 mg/dL (ref 0.50–1.10)
Calcium: 8.4 mg/dL (ref 8.4–10.5)
GFR, EST AFRICAN AMERICAN: 64 mL/min — AB (ref >90)
GFR, EST NON AFRICAN AMERICAN: 56 mL/min — AB (ref >90)
Glucose, Bld: 128 mg/dL — ABNORMAL HIGH (ref 70–99)
POTASSIUM: 4.3 meq/L (ref 3.7–5.3)
Sodium: 138 mEq/L (ref 137–147)

## 2014-06-02 LAB — CBC
HCT: 29.9 % — ABNORMAL LOW (ref 36.0–46.0)
Hemoglobin: 9.7 g/dL — ABNORMAL LOW (ref 12.0–15.0)
MCH: 30.7 pg (ref 26.0–34.0)
MCHC: 32.4 g/dL (ref 30.0–36.0)
MCV: 94.6 fL (ref 78.0–100.0)
PLATELETS: 205 10*3/uL (ref 150–400)
RBC: 3.16 MIL/uL — ABNORMAL LOW (ref 3.87–5.11)
RDW: 15.5 % (ref 11.5–15.5)
WBC: 6.7 10*3/uL (ref 4.0–10.5)

## 2014-06-02 MED ORDER — ENOXAPARIN SODIUM 30 MG/0.3ML ~~LOC~~ SOLN
30.0000 mg | Freq: Every day | SUBCUTANEOUS | Status: DC
  Administered 2014-06-03: 30 mg via SUBCUTANEOUS
  Filled 2014-06-02: qty 0.3

## 2014-06-02 NOTE — Progress Notes (Signed)
Clinical Social Work Department CLINICAL SOCIAL WORK PLACEMENT NOTE 06/02/2014  Patient:  KEMIAH, ROLF  Account Number:  [id number] Admit date:  05/31/2014  Clinical Social Worker:  Lynett Grimes, [place]  Date/time:  06/02/2014 12:00 M  Clinical Social Work is seeking post-discharge placement for this patient at the following level of care:   SKILLED NURSING   (*CSW will update this form in Epic as items are completed)   06/02/2014  Patient/family provided with [facility] Department of Clinical Social Work's list of facilities offering this level of care within the geographic area requested by the patient (or if unable, by the patient's family).  06/02/2014  Patient/family informed of their freedom to choose among providers that offer the needed level of care, that participate in Medicare, Medicaid or managed care program needed by the patient, have an available bed and are willing to accept the patient.  06/02/2014  Patient/family informed of [facility]' ownership interest in [facility], as well as of the fact that they are under no obligation to receive care at this facility.  PASARR submitted to EDS on 06/02/2014 PASARR number received on 06/02/2014  FL2 transmitted to all facilities in geographic area requested by pt/family on  06/02/2014 FL2 transmitted to all facilities within larger geographic area on   Patient informed that his/her managed care company has contracts with or will negotiate with  certain facilities, including the following:     Patient/family informed of bed offers received:   Patient chooses bed at  Physician recommends and patient chooses bed at    Patient to be transferred to  on   Patient to be transferred to facility by  Patient and family notified of transfer on  Name of family member notified:    The following physician request were entered in Epic:   Additional Comments: Olga Coaster, [place] [phone] covering csw 06/02/2014 1500pm

## 2014-06-02 NOTE — Evaluation (Signed)
Physical Therapy Evaluation Patient Details Name: Cynthia Graham MRN: [id number] DOB: [birth year] Today's Date: 06/02/2014   History of Present Illness  80 year old female adm with  hip fx now s/o left hip hemiarthroplasty; PMHx: afib with RVR, HTN, OA, gait d/o  Clinical Impression  Pt will benefit from PT to address deficits below; pt will likely need SNF post acute    Follow Up Recommendations SNF;Supervision/Assistance - 24 hour    Equipment Recommendations  None recommended by PT    Recommendations for Other Services       Precautions / Restrictions Precautions Precautions: Posterior Hip;Fall Restrictions Weight Bearing Restrictions: No Other Position/Activity Restrictions: WBAT LLE      Mobility  Bed Mobility Overal bed mobility: +2 for physical assistance;Needs Assistance Bed Mobility: Supine to Sit     Supine to sit: Max assist;+2 for physical assistance;Total assist     General bed mobility comments: assist with trunk, bil LEs, bed pad utilized to scoot to EOB in supine and sitting; multi-modal cues for all aspects of transition  Transfers Overall transfer level: Needs assistance Equipment used: Rolling walker (2 wheeled) Transfers: Pharmacologist;Sit to/from Stand Sit to Stand: +2 physical assistance;Max assist Stand pivot transfers: +2 physical assistance;Max assist       General transfer comment: assist for anterior-superior wt shift, hip precautions,  hand placement, safety; pt slow to process and requires multi-modal cues for technique and safety throughout; pt began to lean to R and fwd and chair brought to pt to prevent fall  Ambulation/Gait Ambulation/Gait assistance: +2 physical assistance;Max assist Ambulation Distance (Feet): 3 Feet Assistive device: Rolling walker (2 wheeled) Gait Pattern/deviations: Step-to pattern;Trunk flexed;Drifts right/left;Decreased step length - right;Decreased step length - left     General Gait Details: see  above  Stairs            Wheelchair Mobility    Modified Rankin (Stroke Patients Only)       Balance Overall balance assessment: Needs assistance Sitting-balance support: Bilateral upper extremity supported;Feet supported   Sitting balance - Comments: pt tended to lean L while sitting eob but no LOB Postural control: Posterior lean;Right lateral lean;Left lateral lean (L sitting; R and posterior standing) Standing balance support: Bilateral upper extremity supported Standing balance-Leahy Scale: Zero Standing balance comment: max to total A to maintain balance while stepping forward                             Pertinent Vitals/Pain Pain when movement initiated    Home Living Family/patient expects to be discharged to:: Unsure Living Arrangements: Children               Additional Comments: has high commode, tub--but pt sponge bathes, 1-2 steps into house then one level.  Has RW, straight cane but hasn't used either one lately    Prior Function Level of Independence: Independent               Hand Dominance        Extremity/Trunk Assessment   Upper Extremity Assessment: Defer to OT evaluation           Lower Extremity Assessment: Generalized weakness;LLE deficits/detail   LLE Deficits / Details: ankle grossly WFL; 2+/5 grossly hip and knee     Communication   Communication: No difficulties  Cognition Arousal/Alertness: Awake/alert Behavior During Therapy: WFL for tasks assessed/performed Overall Cognitive Status: Difficult to assess Area of Impairment: Following commands;Safety/judgement;Problem solving;Awareness  Following Commands: Follows one step commands inconsistently     Problem Solving: Slow processing;Decreased initiation;Requires verbal cues;Requires tactile cues      General Comments      Exercises        Assessment/Plan    PT Assessment Patient needs continued PT services  PT Diagnosis  Difficulty walking   PT Problem List Decreased strength;Decreased range of motion;Decreased activity tolerance;Decreased balance;Decreased mobility;Decreased knowledge of precautions;Decreased safety awareness;Decreased knowledge of use of DME  PT Treatment Interventions DME instruction;Gait training;Functional mobility training;Therapeutic activities;Therapeutic exercise;Patient/family education   PT Goals (Current goals can be found in the Care Plan section) Acute Rehab PT Goals Patient Stated Goal: get back to being independent PT Goal Formulation: With patient Time For Goal Achievement: 06/09/14 Potential to Achieve Goals: Good    Frequency Min 3X/week   Barriers to discharge        Co-evaluation PT/OT/SLP Co-Evaluation/Treatment: Yes Reason for Co-Treatment: Complexity of the patient's impairments (multi-system involvement);Necessary to address cognition/behavior during functional activity;For patient/therapist safety PT goals addressed during session: Mobility/safety with mobility OT goals addressed during session: ADL's and self-care       End of Session Equipment Utilized During Treatment: Gait belt Activity Tolerance: Patient tolerated treatment well Patient left: with call bell/phone within reach;in chair;with chair alarm set Nurse Communication: Mobility status         Time: [id number] PT Time Calculation (min): 25 min   Charges:   PT Evaluation $Initial PT Evaluation Tier I: 1 Procedure PT Treatments $Therapeutic Activity: 8-22 mins   PT G Codes:          Caswell Corwinara N Arra Connaughton 06/02/2014, 11:55 AM

## 2014-06-02 NOTE — Progress Notes (Signed)
[facility] PROGRESS NOTE  Cynthia Graham IOE:[id number] DOB: 1917/01/16 DOA: 05/31/2014 PCP: Rogelia Boga, MD  Assessment/Plan: Left Hip Fracture -S/p surgical repair 6/5. -Management as per ortho.  A Fib -Not on anticoagulation given fall risk. -Will reduce metoprolol dose from 100 BID to 50 mg BID given episodes of bradycardia. -No further bradycardia on telemetry.  HTN -Fair control. -Watch with reduction in metoprolol dose.  Hyperkalemia -Have reduced supplemental K from IVF. -Resolved.  Code Status: Full Code Family Communication: Daughter [name] at bedside updated on plan of cre.  Disposition Plan: SNF early next week.   Consultants:  Ortho   Antibiotics:  None   Subjective: No complaints other than mild left hip pain.  Objective: Filed Vitals:   06/02/14 0418 06/02/14 0800 06/02/14 1140 06/02/14 1234  BP: 142/71   104/52  Pulse: 83   68  Temp: 97.9 F (36.6 C)     TempSrc: Oral     Resp: 18 16 18    Height:      Weight:      SpO2: 96% 96% 96% 100%    Intake/Output Summary (Last 24 hours) at 06/02/14 1259 Last data filed at 06/02/14 0800  Gross per 24 hour  Intake 1078.34 ml  Output    900 ml  Net 178.34 ml   Filed Weights   06/01/14 0107  Weight: 45 kg (99 lb 3.3 oz)    Exam:   General:  AA Ox3  Cardiovascular: RRR  Respiratory: CTA B  Abdomen: S/NT/ND/+BS  Extremities: trace edema   Neurologic:  Non-focal.  Data Reviewed: Basic Metabolic Panel:  Recent Labs Lab 05/31/14 1805 06/01/14 0530 06/02/14 0516  NA 141 143 138  K 5.1 5.5* 4.3  CL 101 106 102  CO2 23 23 25   GLUCOSE 77 84 128*  BUN 20 18 16   CREATININE 0.63 0.68 0.86  CALCIUM 9.2 8.3* 8.4   Liver Function Tests: No results found for this basename: AST, ALT, ALKPHOS, BILITOT, PROT, ALBUMIN,  in the last 168 hours No results found for this basename: LIPASE, AMYLASE,  in the last 168 hours No results found for this basename: AMMONIA,  in  the last 168 hours CBC:  Recent Labs Lab 05/31/14 1805 06/01/14 0530 06/02/14 0516  WBC 6.1 9.4 6.7  NEUTROABS 4.7  --   --   HGB 12.9 9.7* 9.7*  HCT 39.8 30.1* 29.9*  MCV 93.6 95.6 94.6  PLT 233 199 205   Cardiac Enzymes: No results found for this basename: CKTOTAL, CKMB, CKMBINDEX, TROPONINI,  in the last 168 hours BNP (last 3 results) No results found for this basename: PROBNP,  in the last 8760 hours CBG: No results found for this basename: GLUCAP,  in the last 168 hours  Recent Results (from the past 240 hour(s))  SURGICAL PCR SCREEN     Status: None   Collection Time    05/31/14  9:20 PM      Result Value Ref Range Status   MRSA, PCR NEGATIVE  NEGATIVE Final   Staphylococcus aureus NEGATIVE  NEGATIVE Final   Comment:            The Xpert SA Assay (FDA     approved for NASAL specimens     in patients over 22 years of age),     is one component of     a comprehensive surveillance     program.  Test performance has     been validated by [employer]  [facility] for patients greater     than or equal to 78 year old.     It is not intended     to diagnose infection nor to     guide or monitor treatment.     Studies: Dg Hip Complete Left  05/31/2014   CLINICAL DATA:  History of trauma from a fall.  Left-sided hip pain.  EXAM: LEFT HIP - COMPLETE 2+ VIEW  COMPARISON:  No priors.  FINDINGS: Four views of the bony pelvis and left hip demonstrate an acute displaced transcervical neck fracture. This appears mildly impacted, with slight posterior displacement of the distal fracture fragment. The femoral head remains located in the acetabulum. Remaining portions of the bony pelvis and the visualized right proximal femur appear intact. Status post right hip hemiarthroplasty with a bipolar prosthesis.  IMPRESSION: 1. Acute displaced mildly impacted transcervical neck fracture of the left proximal femur.   Electronically Signed   By: Trudie Reedaniel  Entrikin M.D.   On: 05/31/2014 18:49   Dg  Pelvis Portable  06/01/2014   CLINICAL DATA:  Postoperative from right hip arthroplasty  EXAM: PORTABLE PELVIS 1-2 VIEWS  COMPARISON:  AP pelvis of October 27, 2008  FINDINGS: The patient has undergone left hip arthroplasty. Radiographic positioning of the prosthetic components is good. A previously placed right hip arthroplasty is unchanged.  IMPRESSION: The patient has undergone left hip arthroplasty without evidence of immediate postprocedure complication.   Electronically Signed   By: David  Swazil[place]Jordan   On: 06/01/2014 01:15   Dg Chest Port 1 View  06/01/2014   CLINICAL DATA:  Total hip arthroplasty.  Chest pain.  EXAM: PORTABLE CHEST - 1 VIEW  COMPARISON:  10/17/2012.  FINDINGS: The cardiac silhouette, mediastinal and hilar contours are within normal limits and stable. There is tortuosity and calcification of the thoracic aorta. The lungs demonstrate chronic bronchitic and emphysematous changes but no definite acute overlying pulmonary process. The bony thorax is intact.  IMPRESSION: Chronic lung changes but no acute pulmonary findings.   Electronically Signed   By: Loralie ChampagneMark  Gallerani M.D.   On: 06/01/2014 06:18    Scheduled Meds: . amLODipine  5 mg Oral q morning - 10a  . aspirin EC  81 mg Oral q morning - 10a  . benazepril  40 mg Oral q morning - 10a  . chlorhexidine  60 mL Topical Once  . [START ON 06/03/2014] enoxaparin (LOVENOX) injection  30 mg Subcutaneous Daily  . furosemide  20 mg Oral q morning - 10a  . metoprolol  50 mg Oral BID  . multivitamin with minerals  1 tablet Oral q morning - 10a  . pantoprazole  40 mg Oral Daily  . sodium chloride  3 mL Intravenous Q12H   Continuous Infusions: . sodium chloride 50 mL/hr at 06/01/14 0958  . sodium chloride 50 mL/hr at 06/02/14 0309  . sodium chloride      Principal Problem:   Closed left hip fracture Active Problems:   HYPERLIPIDEMIA   HYPERTENSION   GAIT IMBALANCE   Atrial fibrillation and flutter   Fracture of femoral neck, left    Hyperkalemia    Time spent: 25 minutes. Greater than 50% of this time was spent in direct contact with the patient coordinating care.    Cynthia Graham  [facility] Pager [phone]  If 7PM-7AM, please contact night-coverage at [email or web address], password [email or web address] 06/02/2014, 12:59 PM  LOS: 2 days

## 2014-06-02 NOTE — Evaluation (Signed)
Occupational Therapy Evaluation Patient Details Name: Cynthia Graham MRN: [id number] DOB: 1917-09-26 Today's Date: 06/02/2014    History of Present Illness 80 year old female adm with  hip fx now s/o left hip hemiarthroplasty; PMHx: afib with RVR, HTN, OA, gait d/o   Clinical Impression   Pt was admitted for the above.  She will benefit from skilled OT to increase safety and independence with adls.  Pt was independent prior to admission.  Goals in acute are for min to mod A. She currently needs A x 2 for safety and up to total A for LB adls.    Follow Up Recommendations  SNF    Equipment Recommendations  None recommended by OT    Recommendations for Other Services       Precautions / Restrictions Precautions Precautions: Posterior Hip;Fall Restrictions Weight Bearing Restrictions: No Other Position/Activity Restrictions: WBAT LLE      Mobility Bed Mobility Overal bed mobility: +2 for physical assistance;Needs Assistance Bed Mobility: Supine to Sit     Supine to sit: Max assist;+2 for physical assistance;Total assist     General bed mobility comments: assist with trunk, bil LEs, bed pad utilized to scoot to EOB in supine and sitting; multi-modal cues for all aspects of transition  Transfers Overall transfer level: Needs assistance Equipment used: Rolling walker (2 wheeled) Transfers: Pharmacologist;Sit to/from Stand Sit to Stand: +2 physical assistance;Max assist Stand pivot transfers: +2 physical assistance;Max assist       General transfer comment: assist for anterior-superior wt shift, hip precautions,  hand placement, safety; pt slow to process and requires multi-modal cues for technique and safety throughout; pt began to lean to R and fwd and chair brought to pt to prevent fall    Balance Overall balance assessment: Needs assistance Sitting-balance support: Bilateral upper extremity supported;Feet supported   Sitting balance - Comments: pt tended to lean  L while sitting eob but no LOB Postural control: Posterior lean;Right lateral lean;Left lateral lean (L sitting; R and anterior standing) Standing balance support: Bilateral upper extremity supported Standing balance-Leahy Scale: Zero Standing balance comment: max to total A to maintain balance while stepping forward                            ADL Overall ADL's : Needs assistance/impaired     Grooming: Set up;Sitting   Upper Body Bathing: Supervision/ safety;Sitting   Lower Body Bathing: +2 for safety/equipment;Maximal assistance   Upper Body Dressing : Minimal assistance;Sitting   Lower Body Dressing: +2 for safety/equipment;Total assistance;Sit to/from stand   Toilet Transfer: +2 for physical assistance;Maximal assistance             General ADL Comments: reviewed thps and adls.  Educated/demonstrated AE but did not have sock aide with me.  Pt has a reacher and long sponge.  Daughter present for education but stepped out during bed mobility and transfer.  Pt slow to process     Vision                     Perception     Praxis      Pertinent Vitals/Pain Pt stated no pain but said "ouch" during bed mobility.  Repositioned in chair     Hand Dominance     Extremity/Trunk Assessment Upper Extremity Assessment Upper Extremity Assessment: General weakness           Communication Communication Communication: No difficulties  Cognition Arousal/Alertness: Awake/alert Behavior During Therapy: WFL for tasks assessed/performed Overall Cognitive Status: Difficult to assess Area of Impairment: Following commands;Safety/judgement;Problem solving;Awareness       Following Commands: Follows one step commands inconsistently     Problem Solving: Slow processing;Decreased initiation;Requires verbal cues;Requires tactile cues     General Comments       Exercises       Shoulder Instructions      Home Living Family/patient expects to be  discharged to:: Unsure Living Arrangements: Children                               Additional Comments: has high commode, tub--but pt sponge bathes, 1-2 steps into house then one level.  Has RW, straight cane but hasn't used either one lately      Prior Functioning/Environment Level of Independence: Independent             OT Diagnosis: Generalized weakness   OT Problem List: Decreased strength;Decreased activity tolerance;Impaired balance (sitting and/or standing);Decreased cognition;Decreased knowledge of use of DME or AE;Decreased knowledge of precautions;Pain   OT Treatment/Interventions: Self-care/ADL training;DME and/or AE instruction;Patient/family education;Balance training    OT Goals(Current goals can be found in the care plan section) Acute Rehab OT Goals Patient Stated Goal: get back to being independent OT Goal Formulation: With patient/family Time For Goal Achievement: 06/09/14 Potential to Achieve Goals: Good ADL Goals Pt Will Perform Lower Body Bathing: with adaptive equipment;sit to/from stand;with mod assist Pt Will Transfer to Toilet: stand pivot transfer;bedside commode;with mod assist Additional ADL Goal #1: Pt will complete UB adls from unsupported sitting with supervision Additional ADL Goal #2: Pt will maintain static standing for adls with min A x 2 minutes Additional ADL Goal #3: pt will recall 3/3 thps  OT Frequency: Min 2X/week   Barriers to D/C:            Co-evaluation PT/OT/SLP Co-Evaluation/Treatment: Yes Reason for Co-Treatment: For patient/therapist safety PT goals addressed during session: Mobility/safety with mobility OT goals addressed during session: ADL's and self-care      End of Session Nurse Communication: Mobility status (THPs)  Activity Tolerance: Patient limited by fatigue Patient left: in chair;with call bell/phone within reach;with chair alarm set   Time: [id number] OT Time Calculation (min): 35  min Charges:  OT General Charges $OT Visit: 1 Procedure OT Evaluation $Initial OT Evaluation Tier I: 1 Procedure OT Treatments $Self Care/Home Management : 8-22 mins G-Codes:    Marica OtterMaryellen Brennon Otterness 06/02/2014, 11:55 AM Marica OtterMaryellen Osie Amparo, OTR/L [phone] 06/02/2014

## 2014-06-02 NOTE — Progress Notes (Signed)
Pt seen by physcial therapy, and recommending snf. Pt and pt family now agreeing to snf placement. Pt requested for CSW to speak further with pt daughter Ruel Favors regarding placement options. Pt alert and oriented x3, however was very tired after working with physical therapy. Pt daughter was out of the room, will follow up with pt daughter at later time. Pt given skilled nursing facility list.   Byrd Hesselbach [phone]  ED CSW 06/02/2014 1145am

## 2014-06-02 NOTE — Progress Notes (Signed)
CARE MANAGEMENT NOTE 06/02/2014  Patient:  Cynthia Graham, Cynthia Graham   Account Number:  [id number]  Date Initiated:  06/01/2014  Documentation initiated by:  [facility]  Subjective/Objective Assessment:   ARTHROPLASTY BIPOLAR HIP (Left)     Action/Plan:   waiting PT/OT evaluation   Anticipated DC Date:  06/03/2014   Anticipated DC Plan:  SKILLED NURSING FACILITY  In-house referral  Clinical Social Worker      DC Planning Services  CM consult      Choice offered to / List presented to:             Status of service:  Completed, signed off Medicare Important Message given?  YES (If response is "NO", the following Medicare IM given date fields will be blank) Date Medicare IM given:  05/31/2014 Date Additional Medicare IM given:    Discharge Disposition:  SKILLED NURSING FACILITY  Per UR Regulation:    If discussed at Long Length of Stay Meetings, dates discussed:    Comments:  Plan is for dc to SNF. Isidoro Donning RN CCM Case Mgmt phone [phone]

## 2014-06-02 NOTE — Progress Notes (Signed)
Physical Therapy Treatment Patient Details Name: Cynthia Graham MRN: [id number] DOB: 1917/03/27 Today's Date: 06/02/2014    History of Present Illness 80 year old female adm with  hip fx now s/o left hip hemiarthroplasty; PMHx: afib with RVR, HTN, OA, gait d/o    PT Comments    PM session assisted pt out of recliner chair back to bed with a few side steps.  NT asssited as pt requires + 2 for safety.  Follow Up Recommendations  SNF;Supervision/Assistance - 24 hour     Equipment Recommendations  None recommended by PT    Recommendations for Other Services       Precautions / Restrictions Precautions Precautions: Posterior Hip;Fall Restrictions Weight Bearing Restrictions: No Other Position/Activity Restrictions: WBAT LLE    Mobility  Bed Mobility Overal bed mobility: +2 for physical assistance;Needs Assistance Bed Mobility: Supine to Sit     Supine to sit: Max assist;+2 for physical assistance;Total assist     General bed mobility comments: assist with trunk, bil LEs, bed pad utilized to scoot to EOB in supine and sitting; multi-modal cues for all aspects of transition  Transfers Overall transfer level: Needs assistance Equipment used: Rolling walker (2 wheeled) Transfers: Pharmacologisttand Pivot Transfers;Sit to/from Stand Sit to Stand: +2 physical assistance;Max assist Stand pivot transfers: +2 physical assistance;Max assist       General transfer comment: assist for anterior-superior wt shift, hip precautions,  hand placement, safety; pt slow to process and requires multi-modal cues for technique and safety throughout; pt began to lean to R and fwd and chair brought to pt to prevent fall  Ambulation/Gait Ambulation/Gait assistance: +2 physical assistance;Max assist Ambulation Distance (Feet): 3 Feet Assistive device: Rolling walker (2 wheeled) Gait Pattern/deviations: Step-to pattern;Trunk flexed;Drifts right/left;Decreased step length - right;Decreased step length - left      General Gait Details: see above   Stairs            Wheelchair Mobility    Modified Rankin (Stroke Patients Only)       Balance Overall balance assessment: Needs assistance Sitting-balance support: Bilateral upper extremity supported;Feet supported   Sitting balance - Comments: pt tended to lean L while sitting eob but no LOB Postural control: Posterior lean;Right lateral lean;Left lateral lean (L sitting; R and anterior standing) Standing balance support: Bilateral upper extremity supported Standing balance-Leahy Scale: Zero Standing balance comment: max to total A to maintain balance while stepping forward                    Cognition Arousal/Alertness: Awake/alert Behavior During Therapy: WFL for tasks assessed/performed Overall Cognitive Status: Difficult to assess Area of Impairment: Following commands;Safety/judgement;Problem solving;Awareness       Following Commands: Follows one step commands inconsistently     Problem Solving: Slow processing;Decreased initiation;Requires verbal cues;Requires tactile cues      Exercises      General Comments        Pertinent Vitals/Pain     Home Living Family/patient expects to be discharged to:: Unsure Living Arrangements: Children             Additional Comments: has high commode, tub--but pt sponge bathes, 1-2 steps into house then one level.  Has RW, straight cane but hasn't used either one lately    Prior Function Level of Independence: Independent          PT Goals (current goals can now be found in the care plan section) Acute Rehab PT Goals Patient Stated Goal: get back  to being independent PT Goal Formulation: With patient Time For Goal Achievement: 06/09/14 Potential to Achieve Goals: Good    Frequency  Min 3X/week    PT Plan      Co-evaluation PT/OT/SLP Co-Evaluation/Treatment: Yes Reason for Co-Treatment: Complexity of the patient's impairments (multi-system  involvement);Necessary to address cognition/behavior during functional activity;For patient/therapist safety PT goals addressed during session: Mobility/safety with mobility OT goals addressed during session: ADL's and self-care     End of Session Equipment Utilized During Treatment: Gait belt Activity Tolerance: Patient tolerated treatment well Patient left: with call bell/phone within reach;in chair;with chair alarm set     Time: 1527-1536 PT Time Calculation (min): 9 min  Charges:  $Therapeutic Activity: 8-22 mins                    G Codes:      Felecia Shelling  PTA [facility]  Acute  Rehab Pager      [phone]

## 2014-06-02 NOTE — Progress Notes (Signed)
Patient and pt fmaily refused snf placement upon admission, however CSW will follow while awaiting recommendations from PT/OT in case pt and pt family do agree to higher level of care.   Olga Coaster, LCSW covering csw [phone] 06/02/2014 957am

## 2014-06-03 ENCOUNTER — Encounter (HOSPITAL_COMMUNITY): Payer: Self-pay | Admitting: Orthopedic Surgery

## 2014-06-03 LAB — CBC
HEMATOCRIT: 27.2 % — AB (ref 36.0–46.0)
Hemoglobin: 8.8 g/dL — ABNORMAL LOW (ref 12.0–15.0)
MCH: 30.4 pg (ref 26.0–34.0)
MCHC: 32.4 g/dL (ref 30.0–36.0)
MCV: 94.1 fL (ref 78.0–100.0)
Platelets: 195 10*3/uL (ref 150–400)
RBC: 2.89 MIL/uL — ABNORMAL LOW (ref 3.87–5.11)
RDW: 15.7 % — ABNORMAL HIGH (ref 11.5–15.5)
WBC: 5.8 10*3/uL (ref 4.0–10.5)

## 2014-06-03 MED ORDER — VITAMINS A & D EX OINT
TOPICAL_OINTMENT | CUTANEOUS | Status: AC
  Administered 2014-06-03: 2
  Filled 2014-06-03: qty 10

## 2014-06-03 MED ORDER — METOPROLOL TARTRATE 50 MG PO TABS: 50.0000 mg | ORAL_TABLET | Freq: Two times a day (BID) | ORAL | Status: DC

## 2014-06-03 MED ORDER — HYDROCODONE-ACETAMINOPHEN 5-325 MG PO TABS: 1.0000 | ORAL_TABLET | Freq: Four times a day (QID) | ORAL | Status: DC | PRN

## 2014-06-03 NOTE — Progress Notes (Signed)
Patient is set to discharge to [facility] today. Patient & daughter accepted bed offer [facility] & CSW confirmed with [facility] @ SNF that they would be able to take her today. Discharge packet in [place] - RN, Susie aware. [facility] called for transport.   Clinical Social Work Department CLINICAL SOCIAL WORK PLACEMENT NOTE 06/03/2014  Patient:  Cynthia Graham, Cynthia Graham  Account Number:  [id number] Admit date:  05/31/2014  Clinical Social Worker:  Lynett Grimes, [place]  Date/time:  06/02/2014 12:00 M  Clinical Social Work is seeking post-discharge placement for this patient at the following level of care:   SKILLED NURSING   (*CSW will update this form in Epic as items are completed)   06/02/2014  Patient/family provided with [facility] Department of Clinical Social Work's list of facilities offering this level of care within the geographic area requested by the patient (or if unable, by the patient's family).  06/02/2014  Patient/family informed of their freedom to choose among providers that offer the needed level of care, that participate in Medicare, Medicaid or managed care program needed by the patient, have an available bed and are willing to accept the patient.  06/02/2014  Patient/family informed of [facility]' ownership interest in [facility], as well as of the fact that they are under no obligation to receive care at this facility.  PASARR submitted to EDS on 06/02/2014 PASARR number received on 06/02/2014  FL2 transmitted to all facilities in geographic area requested by pt/family on  06/02/2014 FL2 transmitted to all facilities within larger geographic area on   Patient informed that his/her managed care company has contracts with or will negotiate with  certain facilities, including the following:     Patient/family informed of bed offers received:  06/03/2014 Patient chooses bed at [facility] Physician recommends and patient chooses bed at     Patient to be transferred to [facility]HAB on  06/03/2014 Patient to be transferred to facility by [facility] Patient and family notified of transfer on  Name of family member notified:    The following physician request were entered in Epic:   Additional Comments:   Lincoln Maxin, LCSW [facility] Clinical Social Worker cell #: [phone]

## 2014-06-03 NOTE — Discharge Summary (Signed)
Physician Discharge Summary  Cynthia Graham ZOX:[id number] DOB: 09/25/1917 DOA: 05/31/2014  PCP: Rogelia BogaKWIATKOWSKI,PETER FRANK, MD  Admit date: 05/31/2014 Discharge date: 06/03/2014  Time spent: 45 minutes  Recommendations for Outpatient Follow-up:  -Will be discharged to SNF today for post-hip fracture rehab. -Will need to follow up with Dr. Ranell PatrickNorris in 2 weeks.   Discharge Diagnoses:  Principal Problem:   Closed left hip fracture Active Problems:   HYPERLIPIDEMIA   HYPERTENSION   GAIT IMBALANCE   Atrial fibrillation and flutter   Fracture of femoral neck, left   Hyperkalemia   Discharge Condition: Stable and improved  Filed Weights   06/01/14 0107  Weight: 45 kg (99 lb 3.3 oz)    History of present illness:  Cynthia FleetRuth D Rockford is a 80 y.o. female with a history of Atrial Fibrillation, HTN, Hyperlipidemia, and Gait Instability who suffered a fall on Memorial day and has been bed-bound at home due to increase pain ever since. She denies having any syncope or chest pain associated with her fall, she reports that her legs became weak and she fell. She was taken to her PCP and was evaluated and sent to the ED for further evaluation, and an x-ray was performed which revealed an acute mildly displaced proximal left femoral neck fracture. Orthopedics was consulted and saw patient in the ED and planned for surgery this evening.    Hospital Course:   Left Hip Fracture  -S/p surgical repair 6/5.  -Management as per ortho.  -Lovenox for 30 days post-op per ortho recs for DVT prophylaxis.  A Fib  -Not on anticoagulation given fall risk.  -Will reduce metoprolol dose from 100 BID to 50 mg BID given episodes of bradycardia.  -No further bradycardia on telemetry.   HTN  -Fair control.   Hyperkalemia  -Have reduced supplemental K from IVF.  -Resolved.   Procedures:  Left hip repair.   Consultations:  Ortho  Discharge Instructions  Discharge Instructions   Diet - low sodium heart  healthy    Complete by:  As directed      Discontinue IV    Complete by:  As directed      Increase activity slowly    Complete by:  As directed      Weight bearing as tolerated    Complete by:  As directed   Laterality:  left  Extremity:  Lower     Weight bearing as tolerated    Complete by:  As directed   Laterality:  left  Extremity:  Lower            Medication List    STOP taking these medications       naproxen sodium 220 MG tablet  Commonly known as:  ANAPROX      TAKE these medications       acetaminophen 325 MG tablet  Commonly known as:  TYLENOL  Take 2 tablets (650 mg total) by mouth every 6 (six) hours as needed.     acetaminophen 325 MG tablet  Commonly known as:  TYLENOL  Take 2 tablets (650 mg total) by mouth every 6 (six) hours as needed.     albuterol 108 (90 BASE) MCG/ACT inhaler  Commonly known as:  PROVENTIL HFA;VENTOLIN HFA  Inhale 2 puffs into the lungs every 6 (six) hours as needed for wheezing.     amLODipine 5 MG tablet  Commonly known as:  NORVASC  Take 5 mg by mouth every morning.  aspirin EC 81 MG tablet  Take 81 mg by mouth every morning.     benazepril 40 MG tablet  Commonly known as:  LOTENSIN  Take 40 mg by mouth every morning.     enoxaparin 40 MG/0.4ML injection  Commonly known as:  LOVENOX  Inject 0.4 mLs (40 mg total) into the skin daily. 30 days post op     enoxaparin 40 MG/0.4ML injection  Commonly known as:  LOVENOX  Inject 0.4 mLs (40 mg total) into the skin daily. 30 days post op     furosemide 20 MG tablet  Commonly known as:  LASIX  Take 20 mg by mouth every morning.     HYDROcodone-acetaminophen 5-325 MG per tablet  Commonly known as:  NORCO/VICODIN  Take 1-2 tablets by mouth every 6 (six) hours as needed for moderate pain.     lansoprazole 30 MG capsule  Commonly known as:  PREVACID  Take 30 mg by mouth daily as needed (GERD/heartburn).     LORazepam 0.5 MG tablet  Commonly known as:  ATIVAN  Take  0.5 mg by mouth every 8 (eight) hours as needed for anxiety.     metoprolol 50 MG tablet  Commonly known as:  LOPRESSOR  Take 1 tablet (50 mg total) by mouth 2 (two) times daily.     multivitamin with minerals Tabs tablet  Take 1 tablet by mouth every morning.     polyethylene glycol packet  Commonly known as:  MIRALAX / GLYCOLAX  Take 17 g by mouth 2 (two) times daily as needed for mild constipation.       No Known Allergies     Follow-up Information   Follow up with NORRIS,STEVEN R, MD. Call in 2 weeks. [phone])    Specialty:  Orthopedic Surgery   Contact information:   [address] [place] [address] [phone]       Follow up with Rogelia Boga, MD. Schedule an appointment as soon as possible for a visit in 3 weeks.   Specialty:  Internal Medicine   Contact information:   [address] [place] [address] [phone]        The results of significant diagnostics from this hospitalization (including imaging, microbiology, ancillary and laboratory) are listed below for reference.    Significant Diagnostic Studies: Dg Hip Complete Left  05/31/2014   CLINICAL DATA:  History of trauma from a fall.  Left-sided hip pain.  EXAM: LEFT HIP - COMPLETE 2+ VIEW  COMPARISON:  No priors.  FINDINGS: Four views of the bony pelvis and left hip demonstrate an acute displaced transcervical neck fracture. This appears mildly impacted, with slight posterior displacement of the distal fracture fragment. The femoral head remains located in the acetabulum. Remaining portions of the bony pelvis and the visualized right proximal femur appear intact. Status post right hip hemiarthroplasty with a bipolar prosthesis.  IMPRESSION: 1. Acute displaced mildly impacted transcervical neck fracture of the left proximal femur.   Electronically Signed   By: Trudie Reed M.D.   On: 05/31/2014 18:49   Dg Pelvis Portable  06/01/2014   CLINICAL DATA:   Postoperative from right hip arthroplasty  EXAM: PORTABLE PELVIS 1-2 VIEWS  COMPARISON:  AP pelvis of October 27, 2008  FINDINGS: The patient has undergone left hip arthroplasty. Radiographic positioning of the prosthetic components is good. A previously placed right hip arthroplasty is unchanged.  IMPRESSION: The patient has undergone left hip arthroplasty without evidence of immediate postprocedure complication.  Electronically Signed   By: David  [place]   On: 06/01/2014 01:15   Dg Chest Port 1 View  06/01/2014   CLINICAL DATA:  Total hip arthroplasty.  Chest pain.  EXAM: PORTABLE CHEST - 1 VIEW  COMPARISON:  10/17/2012.  FINDINGS: The cardiac silhouette, mediastinal and hilar contours are within normal limits and stable. There is tortuosity and calcification of the thoracic aorta. The lungs demonstrate chronic bronchitic and emphysematous changes but no definite acute overlying pulmonary process. The bony thorax is intact.  IMPRESSION: Chronic lung changes but no acute pulmonary findings.   Electronically Signed   By: Loralie Champagne M.D.   On: 06/01/2014 06:18    Microbiology: Recent Results (from the past 240 hour(s))  SURGICAL PCR SCREEN     Status: None   Collection Time    05/31/14  9:20 PM      Result Value Ref Range Status   MRSA, PCR NEGATIVE  NEGATIVE Final   Staphylococcus aureus NEGATIVE  NEGATIVE Final   Comment:            The Xpert SA Assay (FDA     approved for NASAL specimens     in patients over 45 years of age),     is one component of     a comprehensive surveillance     program.  Test performance has     been validated by [employer] for patients greater     than or equal to 50 year old.     It is not intended     to diagnose infection nor to     guide or monitor treatment.     Labs: Basic Metabolic Panel:  Recent Labs Lab 05/31/14 1805 06/01/14 0530 06/02/14 0516  NA 141 143 138  K 5.1 5.5* 4.3  CL 101 106 102  CO2 23 23 25   GLUCOSE 77 84 128*    BUN 20 18 16   CREATININE 0.63 0.68 0.86  CALCIUM 9.2 8.3* 8.4   Liver Function Tests: No results found for this basename: AST, ALT, ALKPHOS, BILITOT, PROT, ALBUMIN,  in the last 168 hours No results found for this basename: LIPASE, AMYLASE,  in the last 168 hours No results found for this basename: AMMONIA,  in the last 168 hours CBC:  Recent Labs Lab 05/31/14 1805 06/01/14 0530 06/02/14 0516 06/03/14 0444  WBC 6.1 9.4 6.7 5.8  NEUTROABS 4.7  --   --   --   HGB 12.9 9.7* 9.7* 8.8*  HCT 39.8 30.1* 29.9* 27.2*  MCV 93.6 95.6 94.6 94.1  PLT 233 199 205 195   Cardiac Enzymes: No results found for this basename: CKTOTAL, CKMB, CKMBINDEX, TROPONINI,  in the last 168 hours BNP: BNP (last 3 results) No results found for this basename: PROBNP,  in the last 8760 hours CBG: No results found for this basename: GLUCAP,  in the last 168 hours     Signed:  Henderson Cloud  [facility] Pager: [phone] 06/03/2014, 11:24 AM

## 2014-06-03 NOTE — Care Management Note (Signed)
    Page 1 of 1   06/03/2014     3:15:01 PM CARE MANAGEMENT NOTE 06/03/2014  Patient:  Cynthia Graham, Cynthia Graham   Account Number:  [id number]  Date Initiated:  06/01/2014  Documentation initiated by:  [facility]  Subjective/Objective Assessment:   ARTHROPLASTY BIPOLAR HIP (Left)     Action/Plan:   waiting PT/OT evaluation   Anticipated DC Date:  06/03/2014   Anticipated DC Plan:  SKILLED NURSING FACILITY  In-house referral  Clinical Social Worker      DC Planning Services  CM consult      Choice offered to / List presented to:             Status of service:  Completed, signed off Medicare Important Message given?  YES (If response is "NO", the following Medicare IM given date fields will be blank) Date Medicare IM given:  05/31/2014 Date Additional Medicare IM given:    Discharge Disposition:  SKILLED NURSING FACILITY  Per UR Regulation:  Reviewed for med. necessity/level of care/duration of stay  If discussed at Long Length of Stay Meetings, dates discussed:    Comments:  06/03/14 Kaaren Nass RN,BSN NCM [phone] D/C SNF.  Plan is for dc to SNF. Isidoro Donning RN CCM Case Mgmt phone [phone]

## 2014-06-03 NOTE — Progress Notes (Signed)
   Subjective: 3 Days Post-Op Procedure(s) (LRB): ARTHROPLASTY BIPOLAR HIP (Left)  Mild soreness with movement but otherwise doing well Patient reports pain as mild.  Objective:   VITALS:   Filed Vitals:   06/03/14 0506  BP: 133/67  Pulse: 73  Temp: 97.5 F (36.4 C)  Resp: 19    Left hip incision healing well No drainage or reddness nv intact distally  LABS  Recent Labs  06/01/14 0530 06/02/14 0516 06/03/14 0444  HGB 9.7* 9.7* 8.8*  HCT 30.1* 29.9* 27.2*  WBC 9.4 6.7 5.8  PLT 199 205 195     Recent Labs  05/31/14 1805 06/01/14 0530 06/02/14 0516  NA 141 143 138  K 5.1 5.5* 4.3  BUN 20 18 16   CREATININE 0.63 0.68 0.86  GLUCOSE 77 84 128*     Assessment/Plan: 3 Days Post-Op Procedure(s) (LRB): ARTHROPLASTY BIPOLAR HIP (Left)  Continue PT/OT D/c planning Stable for discharge once cleared from medical standpoint F/u in 2 weeks with Dr. Beryle Lathe, MPAS, PA-C  06/03/2014, 7:13 AM

## 2014-06-03 NOTE — Progress Notes (Signed)
Called report to [facility], IV and tele removed.

## 2014-06-19 ENCOUNTER — Telehealth: Payer: Self-pay | Admitting: Internal Medicine

## 2014-06-19 NOTE — Telephone Encounter (Signed)
Spoke to Hilton HotelsSabre asked her who is taking care of her at home? [name] said she is in rehab yet. Told her they should be able to set up wheelchair for her when she goes home if not just let me know. [name] verbalized understanding.

## 2014-06-19 NOTE — Telephone Encounter (Signed)
Pt's daughter is calling in regards to pt, pt fell and broke her hip, pt is needing an order for a wheel chair.

## 2014-06-25 ENCOUNTER — Telehealth: Payer: Self-pay | Admitting: Internal Medicine

## 2014-06-25 NOTE — Telephone Encounter (Addendum)
Don occupational therapist is calling from [facility] with pt plan of care. Pt will have OT  for twice a wk for one wk and one time a wk for two wks for ADLS, IADLS  Adaptable equipment and transfers. Please with verbal ok

## 2014-06-26 ENCOUNTER — Ambulatory Visit (INDEPENDENT_AMBULATORY_CARE_PROVIDER_SITE_OTHER): Payer: Medicare Other | Admitting: Internal Medicine

## 2014-06-26 ENCOUNTER — Encounter: Payer: Self-pay | Admitting: Internal Medicine

## 2014-06-26 VITALS — BP 138/80 | HR 96 | Temp 98.0°F | Resp 18 | Ht 62.0 in | Wt 85.0 lb

## 2014-06-26 DIAGNOSIS — I10 Essential (primary) hypertension: Secondary | ICD-10-CM

## 2014-06-26 DIAGNOSIS — E785 Hyperlipidemia, unspecified: Secondary | ICD-10-CM

## 2014-06-26 DIAGNOSIS — S72002S Fracture of unspecified part of neck of left femur, sequela: Secondary | ICD-10-CM

## 2014-06-26 DIAGNOSIS — S72009S Fracture of unspecified part of neck of unspecified femur, sequela: Secondary | ICD-10-CM

## 2014-06-26 DIAGNOSIS — I4891 Unspecified atrial fibrillation: Secondary | ICD-10-CM

## 2014-06-26 NOTE — Progress Notes (Signed)
Pre visit review using our clinic review tool, if applicable. No additional management support is needed unless otherwise documented below in the visit note. 

## 2014-06-26 NOTE — Progress Notes (Signed)
Subjective:    Patient ID: Cynthia Graham, female    DOB: 04-10-1917, 80 y.o.   MRN: [id number]  HPI Admit date: 05/31/2014  Discharge date: 06/03/2014  Time spent: 45 minutes  Recommendations for Outpatient Follow-up:  -Will be discharged to SNF today for post-hip fracture rehab.  -Will need to follow up with Dr. Ranell Patrick in 2 weeks.  Discharge Diagnoses:  Principal Problem:  Closed left hip fracture  Active Problems:  HYPERLIPIDEMIA  HYPERTENSION  GAIT IMBALANCE  Atrial fibrillation and flutter  Fracture of femoral neck, left  Hyperkalemia  Discharge Condition: Stable and improved  80 year old patient who is seen today following a recent hospital discharge for treatment of a left hip fracture.  The patient has done amazingly well and is back at home receiving home physical therapy and OT. She has hypertension, dyslipidemia, and a history of atrial fibrillation.  She is felt not to be a candidate for anticoagulation due to her fall risk. She is doing quite well today and accompanied by a daughter.  She is laboratory with a walker  Past Medical History  Diagnosis Date  . ANXIETY 05/17/2008  . ATRIAL FIBRILLATION WITH RAPID VENTRICULAR RESPONSE 10/09/2009  . CALLUS, RIGHT FOOT 02/11/2009  . EPISTAXIS, RECURRENT 01/15/2010  . GAIT IMBALANCE 08/16/2008  . GERD 08/08/2007  . HYPERLIPIDEMIA 05/17/2008  . HYPERTENSION 08/08/2007  . OSTEOARTHRITIS 05/17/2008  . SOB 08/18/2007  . WEAKNESS 05/17/2008  . DJD (degenerative joint disease)   . History of hiatal hernia   . Stroke     History   Social History  . Marital Status: Married    Spouse Name: N/A    Number of Children: N/A  . Years of Education: N/A   Occupational History  . Not on file.   Social History Main Topics  . Smoking status: Former Smoker    Types: Cigarettes    Quit date: 12/27/1968  . Smokeless tobacco: Never Used  . Alcohol Use: No  . Drug Use: No  . Sexual Activity: No   Other Topics Concern  . Not on file    Social History Narrative  . No narrative on file    Past Surgical History  Procedure Laterality Date  . Abdominal hysterectomy    . Esophagogastroduodenoscopy      stricture, hiatal hernia ,reflux esopg.  . Right hip fracture  October 2009    Dr Charlann Boxer  . Colonoscopy N/A 06/12/2013    Procedure: COLONOSCOPY;  Surgeon: Meryl Dare, MD;  Location: [facility];  Service: Endoscopy;  Laterality: N/A;  . Hip arthroplasty Left 05/31/2014    Procedure: ARTHROPLASTY BIPOLAR HIP;  Surgeon: Verlee Rossetti, MD;  Location: [facility] ORS;  Service: Orthopedics;  Laterality: Left;    Family History  Problem Relation Age of Onset  . Hypertension Neg Hx     family    No Known Allergies  Current Outpatient Prescriptions on File Prior to Visit  Medication Sig Dispense Refill  . acetaminophen (TYLENOL) 325 MG tablet Take 2 tablets (650 mg total) by mouth every 6 (six) hours as needed.  60 tablet  1  . acetaminophen (TYLENOL) 325 MG tablet Take 2 tablets (650 mg total) by mouth every 6 (six) hours as needed.  60 tablet  1  . albuterol (PROVENTIL HFA;VENTOLIN HFA) 108 (90 BASE) MCG/ACT inhaler Inhale 2 puffs into the lungs every 6 (six) hours as needed for wheezing.       Marland Kitchen amLODipine (NORVASC) 5 MG tablet Take 5 mg  by mouth every morning.      Marland Kitchen. aspirin EC 81 MG tablet Take 81 mg by mouth every morning.      . benazepril (LOTENSIN) 40 MG tablet Take 40 mg by mouth every morning.      . enoxaparin (LOVENOX) 40 MG/0.4ML injection Inject 0.4 mLs (40 mg total) into the skin daily. 30 days post op  30 mL  0  . furosemide (LASIX) 20 MG tablet Take 20 mg by mouth every morning.      . lansoprazole (PREVACID) 30 MG capsule Take 30 mg by mouth daily as needed (GERD/heartburn).      . LORazepam (ATIVAN) 0.5 MG tablet Take 0.5 mg by mouth every 8 (eight) hours as needed for anxiety.      . metoprolol (LOPRESSOR) 50 MG tablet Take 1 tablet (50 mg total) by mouth 2 (two) times daily.      . Multiple Vitamin  (MULTIVITAMIN WITH MINERALS) TABS Take 1 tablet by mouth every morning.       . polyethylene glycol (MIRALAX / GLYCOLAX) packet Take 17 g by mouth 2 (two) times daily as needed for mild constipation.      Marland Kitchen. HYDROcodone-acetaminophen (NORCO/VICODIN) 5-325 MG per tablet Take 1-2 tablets by mouth every 6 (six) hours as needed for moderate pain.  30 tablet  0   No current facility-administered medications on file prior to visit.    BP 138/80  Pulse 96  Temp(Src) 98 F (36.7 C) (Oral)  Resp 18  Ht 5\' 2"  (1.575 m)  Wt 85 lb (38.556 kg)  BMI 15.54 kg/m2  SpO2 96%      Review of Systems  Constitutional: Negative.   HENT: Negative for congestion, dental problem, hearing loss, rhinorrhea, sinus pressure, sore throat and tinnitus.   Eyes: Negative for pain, discharge and visual disturbance.  Respiratory: Negative for cough and shortness of breath.   Cardiovascular: Negative for chest pain, palpitations and leg swelling.  Gastrointestinal: Negative for nausea, vomiting, abdominal pain, diarrhea, constipation, blood in stool and abdominal distention.  Genitourinary: Negative for dysuria, urgency, frequency, hematuria, flank pain, vaginal bleeding, vaginal discharge, difficulty urinating, vaginal pain and pelvic pain.  Musculoskeletal: Positive for gait problem. Negative for arthralgias and joint swelling.  Skin: Negative for rash.  Neurological: Negative for dizziness, syncope, speech difficulty, weakness, numbness and headaches.  Hematological: Negative for adenopathy.  Psychiatric/Behavioral: Negative for behavioral problems, dysphoric mood and agitation. The patient is not nervous/anxious.        Objective:   Physical Exam  Constitutional: She is oriented to person, place, and time. She appears well-developed and well-nourished. No distress.  HENT:  Head: Normocephalic.  Right Ear: External ear normal.  Left Ear: External ear normal.  Mouth/Throat: Oropharynx is clear and moist.    Eyes: Conjunctivae and EOM are normal. Pupils are equal, round, and reactive to light.  Neck: Normal range of motion. Neck supple. No thyromegaly present.  Cardiovascular: Normal rate and normal heart sounds.   Controlled ventricular response  Pulmonary/Chest: Effort normal and breath sounds normal.  Abdominal: Soft. Bowel sounds are normal. She exhibits no mass. There is no tenderness.  Musculoskeletal: Normal range of motion. She exhibits no edema.  Lymphadenopathy:    She has no cervical adenopathy.  Neurological: She is alert and oriented to person, place, and time.  Skin: Skin is warm and dry. No rash noted.  Psychiatric: She has a normal mood and affect. Her behavior is normal.  Assessment & Plan:   Stable status post left hip fracture Hypertension Chronic atrial fibrillation.  Controlled ventricular response Dyslipidemia  No change in medication Followup in the fall as needed for flu vaccine and general evaluation

## 2014-06-26 NOTE — Telephone Encounter (Signed)
Left verbal orders on personal voicemail for Roe CoombsDon, okay to do OT for twice a wk for one wk and one time a wk for two wks for ADLS, ADLS Adaptable equipment and transfers for pt per Dr. Amador CunasKwiatkowski.

## 2014-06-26 NOTE — Patient Instructions (Signed)
Limit your sodium (Salt) intake  Return in 4 months for follow-up  

## 2014-07-03 DIAGNOSIS — L8992 Pressure ulcer of unspecified site, stage 2: Secondary | ICD-10-CM

## 2014-07-03 DIAGNOSIS — R269 Unspecified abnormalities of gait and mobility: Secondary | ICD-10-CM

## 2014-07-03 DIAGNOSIS — M6281 Muscle weakness (generalized): Secondary | ICD-10-CM

## 2014-07-03 DIAGNOSIS — R262 Difficulty in walking, not elsewhere classified: Secondary | ICD-10-CM

## 2014-07-03 DIAGNOSIS — Z9181 History of falling: Secondary | ICD-10-CM

## 2014-07-03 DIAGNOSIS — I10 Essential (primary) hypertension: Secondary | ICD-10-CM

## 2014-07-03 DIAGNOSIS — Z471 Aftercare following joint replacement surgery: Secondary | ICD-10-CM

## 2014-07-03 DIAGNOSIS — F411 Generalized anxiety disorder: Secondary | ICD-10-CM

## 2014-09-16 ENCOUNTER — Telehealth: Payer: Self-pay | Admitting: Internal Medicine

## 2014-09-16 MED ORDER — LORAZEPAM 0.5 MG PO TABS: 0.5000 mg | ORAL_TABLET | Freq: Three times a day (TID) | ORAL | Status: DC | PRN

## 2014-09-16 NOTE — Telephone Encounter (Signed)
Pt request refill of the following:  LORazepam (ATIVAN) 0.5 MG tablet   Phamacy:  [facility] [address]

## 2014-09-16 NOTE — Telephone Encounter (Signed)
Pt's daughter Ruel Favors notified Rx called into pharmacy.

## 2014-10-15 ENCOUNTER — Ambulatory Visit: Payer: Medicare Other | Admitting: Internal Medicine

## 2014-10-31 ENCOUNTER — Encounter: Payer: Self-pay | Admitting: Internal Medicine

## 2014-10-31 ENCOUNTER — Encounter: Payer: Self-pay | Admitting: *Deleted

## 2014-10-31 ENCOUNTER — Ambulatory Visit (INDEPENDENT_AMBULATORY_CARE_PROVIDER_SITE_OTHER): Payer: Medicare Other | Admitting: Internal Medicine

## 2014-10-31 VITALS — BP 110/62 | HR 92 | Temp 97.4°F | Resp 22 | Ht 62.0 in | Wt 86.0 lb

## 2014-10-31 DIAGNOSIS — E876 Hypokalemia: Secondary | ICD-10-CM

## 2014-10-31 DIAGNOSIS — M150 Primary generalized (osteo)arthritis: Secondary | ICD-10-CM

## 2014-10-31 DIAGNOSIS — M159 Polyosteoarthritis, unspecified: Secondary | ICD-10-CM

## 2014-10-31 DIAGNOSIS — R5383 Other fatigue: Secondary | ICD-10-CM

## 2014-10-31 NOTE — Patient Instructions (Signed)
Limit your sodium (Salt) intake  Please check your blood pressure on a regular basis.  If it is consistently greater than 150/90, please make an office appointment.  Return in 6 months for follow-up   

## 2014-10-31 NOTE — Progress Notes (Signed)
Subjective:    Patient ID: Cynthia Graham, female    DOB: [birth year], 80 y.o.   MRN: [id number]  HPI  80 year old patient who is seen today in follow-up.  She has a history of hypertension and atrial fibrillation.  She has done quite well over the past few months.  She was hospitalized earlier in the year following a hip fracture.  No concerns or complaints.  She is accompanied by her daughter who feels she has done well. She has a history of unsteady gait and not felt to be a candidate for chronic anticoagulation.  No recent falls  Past Medical History  Diagnosis Date  . ANXIETY 05/17/2008  . ATRIAL FIBRILLATION WITH RAPID VENTRICULAR RESPONSE 10/09/2009  . CALLUS, RIGHT FOOT 02/11/2009  . EPISTAXIS, RECURRENT 01/15/2010  . GAIT IMBALANCE 08/16/2008  . GERD 08/08/2007  . HYPERLIPIDEMIA 05/17/2008  . HYPERTENSION 08/08/2007  . OSTEOARTHRITIS 05/17/2008  . SOB 08/18/2007  . WEAKNESS 05/17/2008  . DJD (degenerative joint disease)   . History of hiatal hernia   . Stroke     History   Social History  . Marital Status: Married    Spouse Name: N/A    Number of Children: N/A  . Years of Education: N/A   Occupational History  . Not on file.   Social History Main Topics  . Smoking status: Former Smoker    Types: Cigarettes    Quit date: 12/27/1968  . Smokeless tobacco: Never Used  . Alcohol Use: No  . Drug Use: No  . Sexual Activity: No   Other Topics Concern  . Not on file   Social History Narrative    Past Surgical History  Procedure Laterality Date  . Abdominal hysterectomy    . Esophagogastroduodenoscopy      stricture, hiatal hernia ,reflux esopg.  . Right hip fracture  October 2009    Dr Charlann Boxerlin  . Colonoscopy N/A 06/12/2013    Procedure: COLONOSCOPY;  Surgeon: Meryl DareMalcolm T Stark, MD;  Location: [facility];  Service: Endoscopy;  Laterality: N/A;  . Hip arthroplasty Left 05/31/2014    Procedure: ARTHROPLASTY BIPOLAR HIP;  Surgeon: Verlee RossettiSteven R Norris, MD;  Location: [facility];   Service: Orthopedics;  Laterality: Left;    Family History  Problem Relation Age of Onset  . Hypertension Neg Hx     family    No Known Allergies  Current Outpatient Prescriptions on File Prior to Visit  Medication Sig Dispense Refill  . acetaminophen (TYLENOL) 325 MG tablet Take 2 tablets (650 mg total) by mouth every 6 (six) hours as needed. 60 tablet 1  . acetaminophen (TYLENOL) 325 MG tablet Take 2 tablets (650 mg total) by mouth every 6 (six) hours as needed. 60 tablet 1  . albuterol (PROVENTIL HFA;VENTOLIN HFA) 108 (90 BASE) MCG/ACT inhaler Inhale 2 puffs into the lungs every 6 (six) hours as needed for wheezing.     Marland Kitchen. amLODipine (NORVASC) 5 MG tablet Take 5 mg by mouth every morning.    Marland Kitchen. aspirin EC 81 MG tablet Take 81 mg by mouth every morning.    . benazepril (LOTENSIN) 40 MG tablet Take 40 mg by mouth every morning.    . enoxaparin (LOVENOX) 40 MG/0.4ML injection Inject 0.4 mLs (40 mg total) into the skin daily. 30 days post op 30 mL 0  . furosemide (LASIX) 20 MG tablet Take 20 mg by mouth every morning.    . lansoprazole (PREVACID) 30 MG capsule Take 30 mg by mouth daily as  needed (GERD/heartburn).    . LORazepam (ATIVAN) 0.5 MG tablet Take 1 tablet (0.5 mg total) by mouth every 8 (eight) hours as needed for anxiety. 30 tablet 2  . metoprolol (LOPRESSOR) 50 MG tablet Take 1 tablet (50 mg total) by mouth 2 (two) times daily.    . Multiple Vitamin (MULTIVITAMIN WITH MINERALS) TABS Take 1 tablet by mouth every morning.     . polyethylene glycol (MIRALAX / GLYCOLAX) packet Take 17 g by mouth 2 (two) times daily as needed for mild constipation.     No current facility-administered medications on file prior to visit.    BP 110/62 mmHg  Pulse 92  Temp(Src) 97.4 F (36.3 C) (Oral)  Resp 22  Ht 5\' 2"  (1.575 m)  Wt 86 lb (39.009 kg)  BMI 15.73 kg/m2     Review of Systems  Constitutional: Positive for fatigue.  HENT: Positive for hearing loss. Negative for congestion,  dental problem, rhinorrhea, sinus pressure, sore throat and tinnitus.   Eyes: Negative for pain, discharge and visual disturbance.  Respiratory: Negative for cough and shortness of breath.   Cardiovascular: Negative for chest pain, palpitations and leg swelling.  Gastrointestinal: Negative for nausea, vomiting, abdominal pain, diarrhea, constipation, blood in stool and abdominal distention.  Genitourinary: Negative for dysuria, urgency, frequency, hematuria, flank pain, vaginal bleeding, vaginal discharge, difficulty urinating, vaginal pain and pelvic pain.  Musculoskeletal: Positive for back pain, arthralgias and gait problem. Negative for joint swelling.  Skin: Negative for rash.  Neurological: Negative for dizziness, syncope, speech difficulty, weakness, numbness and headaches.  Hematological: Negative for adenopathy.  Psychiatric/Behavioral: Negative for behavioral problems, dysphoric mood and agitation. The patient is not nervous/anxious.        Objective:   Physical Exam  Constitutional: She is oriented to person, place, and time. She appears well-developed and well-nourished.  Elderly, frail, no distress Blood pressure 130/70  HENT:  Head: Normocephalic.  Right Ear: External ear normal.  Left Ear: External ear normal.  Mouth/Throat: Oropharynx is clear and moist.  Eyes: Conjunctivae and EOM are normal. Pupils are equal, round, and reactive to light.  Neck: Normal range of motion. Neck supple. No thyromegaly present.  Cardiovascular: Normal rate and normal heart sounds.   Irregular.  Controlled ventricular response  Pulmonary/Chest: Effort normal.  Basilar rales, right greater than the left  Abdominal: Soft. Bowel sounds are normal. She exhibits no mass. There is no tenderness.  Musculoskeletal: Normal range of motion.  Lymphadenopathy:    She has no cervical adenopathy.  Neurological: She is alert and oriented to person, place, and time.  Skin: Skin is warm and dry. No rash  noted.  Psychiatric: She has a normal mood and affect. Her behavior is normal.          Assessment & Plan:   Hypertension, well controlled Atrial fibrillation Osteoarthritis Gen. Debility  No change in medications Recheck 6 months or as needed

## 2014-10-31 NOTE — Progress Notes (Signed)
Pre visit review using our clinic review tool, if applicable. No additional management support is needed unless otherwise documented below in the visit note. 

## 2014-11-18 ENCOUNTER — Other Ambulatory Visit: Payer: Self-pay | Admitting: Internal Medicine

## 2014-11-18 ENCOUNTER — Telehealth: Payer: Self-pay | Admitting: Internal Medicine

## 2014-11-18 MED ORDER — LANSOPRAZOLE 30 MG PO CPDR: 30.0000 mg | DELAYED_RELEASE_CAPSULE | Freq: Every day | ORAL | Status: AC | PRN

## 2014-11-18 NOTE — Telephone Encounter (Signed)
Spoke to pt's daughter Ruel FavorsSabra, told her Dr. Kirtland BouchardK said can try to stop Prevacid for a trial but needs to continue all other medications. Also Rx's sent to pharmacy. [name] verbalized understanding.

## 2014-11-18 NOTE — Telephone Encounter (Signed)
Okay to give trial off Prevacid but needs to continue all other medications

## 2014-11-18 NOTE — Telephone Encounter (Signed)
Dr. Kirtland BouchardK, were you going to discontinue any medications? I do not see anything in your last note.

## 2014-11-18 NOTE — Telephone Encounter (Signed)
Pt's daughter calling on behalf of pt requesting:  1. Refills of amLODipine (NORVASC) 5 MG tablet & lansoprazole (PREVACID) 30 MG capsule sent to [facility] [address]  2.  Call back from Dr. Charm RingsK's nurse to follow up on medications pt needs to discontinue.  States PCP discussed at last ov that he was going to discontinue some of the pt's medication but never advised which ones.

## 2014-11-27 IMAGING — CR DG CHEST 1V PORT
1 series · 1 of 1 positions shown · non-contrast
Comparison: 10/17/2012.

CLINICAL DATA: Total hip arthroplasty.  Chest pain.

EXAM:
PORTABLE CHEST - 1 VIEW

[AP]
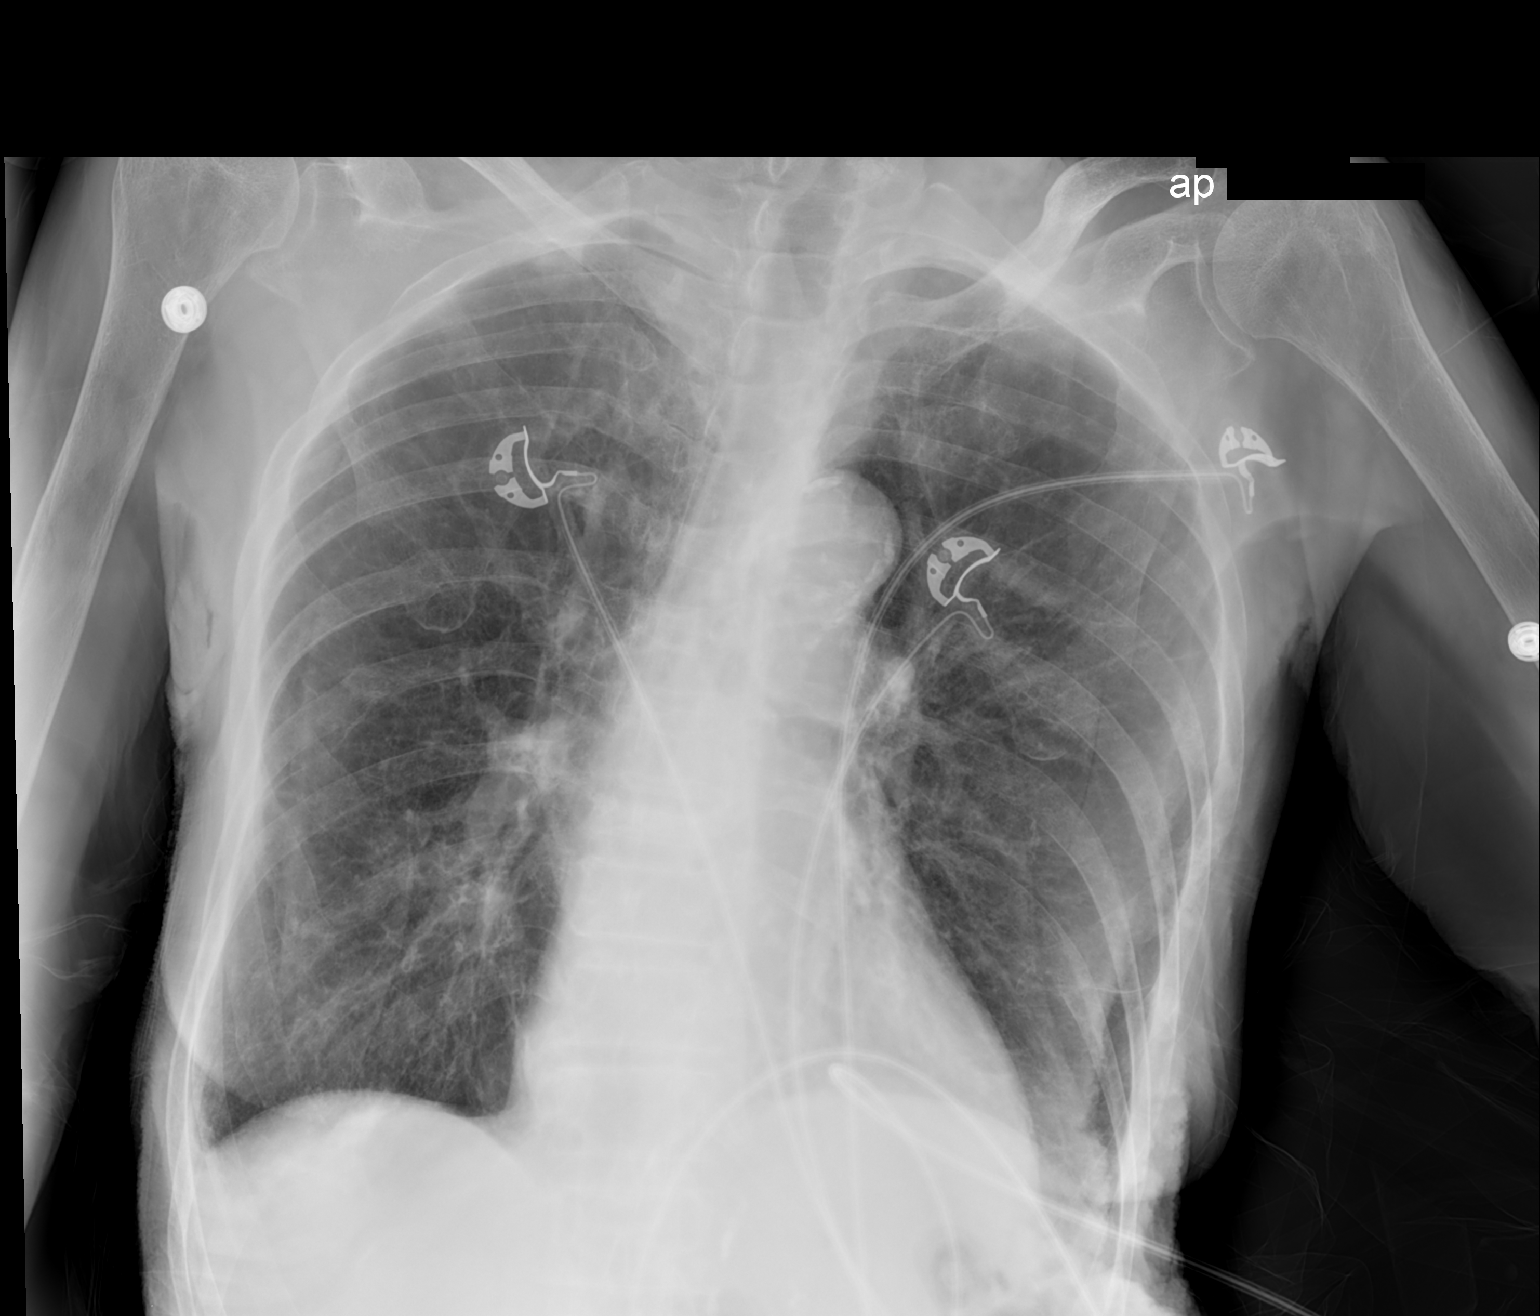

[1 of 1 positions shown; findings below may reference images not displayed]

FINDINGS: The cardiac silhouette, mediastinal and hilar contours are within
normal limits and stable. There is tortuosity and calcification of
the thoracic aorta. The lungs demonstrate chronic bronchitic and
emphysematous changes but no definite acute overlying pulmonary
process. The bony thorax is intact.
IMPRESSION: Chronic lung changes but no acute pulmonary findings.

## 2014-12-10 ENCOUNTER — Telehealth: Payer: Self-pay | Admitting: *Deleted

## 2014-12-10 MED ORDER — PANTOPRAZOLE SODIUM 40 MG PO TBEC: 40.0000 mg | DELAYED_RELEASE_TABLET | Freq: Every day | ORAL | Status: AC | PRN

## 2014-12-10 NOTE — Telephone Encounter (Signed)
Spoke to pt's daughter Cynthia Graham, told her received fax from pharmacy stating the Lansoprazole will not be covered by insurance come Jan 2016, need to change to Pantoprazole 40 mg so it will be covered. Told [name] will send new Rx so it is there when they need refill. [name] verbalized understanding. Rx sent.

## 2015-03-10 ENCOUNTER — Telehealth: Payer: Self-pay | Admitting: Internal Medicine

## 2015-03-10 NOTE — Telephone Encounter (Signed)
Needs office visit to assess

## 2015-03-10 NOTE — Telephone Encounter (Signed)
Please see message and advise 

## 2015-03-10 NOTE — Telephone Encounter (Signed)
Daughter would like advise on caring for pt.  Since Nov, pt habit have changed.  Pt will not come out of her bedroom, or go anywhere. Tthinks on set of dementia.  Pt has potty chair beside bed and pt is using the floor. This started about 2 wks ago.Daughter states pt also has a blank stare stare like for she might not know her for just that second. Would like to know if there is anything you can do.

## 2015-03-11 NOTE — Telephone Encounter (Signed)
Spoke to [name] told her Dr.K needs to see pt to assess what is going on. [name] verbalized understanding and will call back and schedule appointment.

## 2015-04-14 NOTE — Telephone Encounter (Signed)
Patient's daughter Ruel FavorsSabra called to schedule an appointment to see Dr. Kirtland BouchardK regarding the below information.  She was wanting the appointment made with out the patient coming.  She is aware of what Lupita LeashDonna advised her but states patient shakes so much that she can't handle taking her to appointments. She would like a callback.

## 2015-04-15 NOTE — Telephone Encounter (Signed)
Spoke to pt's daughter Cynthia Graham, told her unfortunately the only way we can help pt and order home health and what ever else she needs she will have to be seen and evaluated, face to face or insurance will not cover. [name] verbalized understanding and stated pt can hardly walk. Told her if she can have someone help her in the car when she gets here we have wheelchairs.Told her Dr. Kirtland BouchardK will not be back till May 2, but there is other providers that can see her. [name] verbalized understanding.

## 2015-04-16 ENCOUNTER — Telehealth: Payer: Self-pay | Admitting: Internal Medicine

## 2015-04-16 NOTE — Telephone Encounter (Signed)
Pt daughter called Dina Richandshe wants to discuss her Mom . She is questioning her Mom having azheimer and will be needing some help. Will she need a 30 minute appt or 15 minute to discuss this

## 2015-04-17 ENCOUNTER — Telehealth: Payer: Self-pay | Admitting: Internal Medicine

## 2015-04-17 MED ORDER — LORAZEPAM 0.5 MG PO TABS: 0.5000 mg | ORAL_TABLET | Freq: Three times a day (TID) | ORAL | Status: DC | PRN

## 2015-04-17 NOTE — Telephone Encounter (Signed)
Told Cynthia Graham need 30 minute appointment for face to face. Elnita MaxwellCheryl said she will call pt and schedule.

## 2015-04-17 NOTE — Telephone Encounter (Signed)
Rx called in to pharmacy. 

## 2015-04-17 NOTE — Telephone Encounter (Signed)
Pt request refill of the following: LORazepam (ATIVAN) 0.5 MG tablet ° ° °Phamacy:  [facility] [address]  ° °

## 2015-05-02 ENCOUNTER — Ambulatory Visit (INDEPENDENT_AMBULATORY_CARE_PROVIDER_SITE_OTHER): Payer: Medicaid Other | Admitting: Internal Medicine

## 2015-05-02 ENCOUNTER — Encounter: Payer: Self-pay | Admitting: Internal Medicine

## 2015-05-02 ENCOUNTER — Other Ambulatory Visit: Payer: Self-pay | Admitting: Internal Medicine

## 2015-05-02 VITALS — BP 160/95 | HR 76 | Temp 97.7°F | Resp 18 | Ht 62.0 in | Wt 82.0 lb

## 2015-05-02 DIAGNOSIS — I10 Essential (primary) hypertension: Secondary | ICD-10-CM

## 2015-05-02 DIAGNOSIS — F0391 Unspecified dementia with behavioral disturbance: Secondary | ICD-10-CM

## 2015-05-02 DIAGNOSIS — E46 Unspecified protein-calorie malnutrition: Secondary | ICD-10-CM

## 2015-05-02 DIAGNOSIS — R269 Unspecified abnormalities of gait and mobility: Secondary | ICD-10-CM

## 2015-05-02 DIAGNOSIS — F03918 Unspecified dementia, unspecified severity, with other behavioral disturbance: Secondary | ICD-10-CM

## 2015-05-02 LAB — COMPREHENSIVE METABOLIC PANEL
ALK PHOS: 130 U/L — AB (ref 39–117)
ALT: 5 U/L (ref 0–35)
AST: 10 U/L (ref 0–37)
Albumin: 3.2 g/dL — ABNORMAL LOW (ref 3.5–5.2)
BUN: 6 mg/dL (ref 6–23)
CO2: 36 mEq/L — ABNORMAL HIGH (ref 19–32)
Calcium: 8.8 mg/dL (ref 8.4–10.5)
Chloride: 100 mEq/L (ref 96–112)
Creatinine, Ser: 0.64 mg/dL (ref 0.40–1.20)
GFR: 110.66 mL/min (ref >60.00)
GLUCOSE: 82 mg/dL (ref 70–99)
Potassium: 3 mEq/L — ABNORMAL LOW (ref 3.5–5.1)
Sodium: 143 mEq/L (ref 135–145)
Total Bilirubin: 0.7 mg/dL (ref 0.2–1.2)
Total Protein: 6.9 g/dL (ref 6.0–8.3)

## 2015-05-02 LAB — CBC WITH DIFFERENTIAL/PLATELET
BASOS PCT: 0.5 % (ref 0.0–3.0)
Basophils Absolute: 0 10*3/uL (ref 0.0–0.1)
Eosinophils Absolute: 0 10*3/uL (ref 0.0–0.7)
Eosinophils Relative: 1.3 % (ref 0.0–5.0)
HCT: 39.2 % (ref 36.0–46.0)
Hemoglobin: 13 g/dL (ref 12.0–15.0)
Lymphocytes Relative: 29.1 % (ref 12.0–46.0)
Lymphs Abs: 1.1 10*3/uL (ref 0.7–4.0)
MCHC: 33.3 g/dL (ref 30.0–36.0)
MCV: 89.4 fl (ref 78.0–100.0)
Monocytes Absolute: 0.4 10*3/uL (ref 0.1–1.0)
Monocytes Relative: 9.1 % (ref 3.0–12.0)
NEUTROS PCT: 60 % (ref 43.0–77.0)
Neutro Abs: 2.3 10*3/uL (ref 1.4–7.7)
Platelets: 171 10*3/uL (ref 150.0–400.0)
RBC: 4.38 Mil/uL (ref 3.87–5.11)
RDW: 16.8 % — AB (ref 11.5–15.5)
WBC: 3.9 10*3/uL — ABNORMAL LOW (ref 4.0–10.5)

## 2015-05-02 LAB — TSH: TSH: 2.67 u[IU]/mL (ref 0.35–4.50)

## 2015-05-02 NOTE — Progress Notes (Signed)
Pre visit review using our clinic review tool, if applicable. No additional management support is needed unless otherwise documented below in the visit note. 

## 2015-05-02 NOTE — Patient Instructions (Signed)
Limit your sodium (Salt) intake  Return in 6 months for follow-up  

## 2015-05-02 NOTE — Progress Notes (Signed)
Subjective:    Patient ID: Cynthia Graham, female    DOB: 01-Jun-1917, 81 y.o.   MRN: [id number]  HPI  Wt Readings from Last 3 Encounters:  05/02/15 82 lb (37.195 kg)  10/31/14 86 lb (39.009 kg)  06/26/14 85 lb (38.556 kg)    BP Readings from Last 3 Encounters:  10/31/14 110/62  06/26/14 138/80  06/03/14 [id number]   81 year old patient who is seen today for a face to face evaluation for assistance with home health services. She was hospitalized in June following a left hip fracture.  She has a weakness and unsteady gait and remains a very high fall risk.  She requires assistance with ambulation. She would benefit greatly from a wheelchair to improve her mobility and minimize her chance of injury  She has severe dementia and is unable to name the year, season, date or state of residence. She lives with a daughter and requires assistance in all aspects of daily living.  At times her dementia is severe to the point where she does not recognize family members.  She has not been taking her medications and has had a difficult time swallowing.  She continues to lose weight  Past Medical History  Diagnosis Date  . ANXIETY 05/17/2008  . ATRIAL FIBRILLATION WITH RAPID VENTRICULAR RESPONSE 10/09/2009  . CALLUS, RIGHT FOOT 02/11/2009  . EPISTAXIS, RECURRENT 01/15/2010  . GAIT IMBALANCE 08/16/2008  . GERD 08/08/2007  . HYPERLIPIDEMIA 05/17/2008  . HYPERTENSION 08/08/2007  . OSTEOARTHRITIS 05/17/2008  . SOB 08/18/2007  . WEAKNESS 05/17/2008  . DJD (degenerative joint disease)   . History of hiatal hernia   . Stroke     History   Social History  . Marital Status: Married    Spouse Name: N/A  . Number of Children: N/A  . Years of Education: N/A   Occupational History  . Not on file.   Social History Main Topics  . Smoking status: Former Smoker    Types: Cigarettes    Quit date: 12/27/1968  . Smokeless tobacco: Never Used  . Alcohol Use: No  . Drug Use: No  . Sexual Activity: No    Other Topics Concern  . Not on file   Social History Narrative    Past Surgical History  Procedure Laterality Date  . Abdominal hysterectomy    . Esophagogastroduodenoscopy      stricture, hiatal hernia ,reflux esopg.  . Right hip fracture  October 2009    Dr Charlann Boxerlin  . Colonoscopy N/A 06/12/2013    Procedure: COLONOSCOPY;  Surgeon: Meryl DareMalcolm T Stark, MD;  Location: [facility];  Service: Endoscopy;  Laterality: N/A;  . Hip arthroplasty Left 05/31/2014    Procedure: ARTHROPLASTY BIPOLAR HIP;  Surgeon: Verlee RossettiSteven R Norris, MD;  Location: [facility];  Service: Orthopedics;  Laterality: Left;    Family History  Problem Relation Age of Onset  . Hypertension Neg Hx     family    No Known Allergies  Current Outpatient Prescriptions on File Prior to Visit  Medication Sig Dispense Refill  . acetaminophen (TYLENOL) 325 MG tablet Take 2 tablets (650 mg total) by mouth every 6 (six) hours as needed. 60 tablet 1  . albuterol (PROVENTIL HFA;VENTOLIN HFA) 108 (90 BASE) MCG/ACT inhaler Inhale 2 puffs into the lungs every 6 (six) hours as needed for wheezing.     Marland Kitchen. amLODipine (NORVASC) 5 MG tablet TAKE 1 TABLET (5 MG TOTAL) BY MOUTH EVERY MORNING. 90 tablet 1  . aspirin EC 81 MG tablet  Take 81 mg by mouth every morning.    . benazepril (LOTENSIN) 40 MG tablet Take 40 mg by mouth every morning.    . enoxaparin (LOVENOX) 40 MG/0.4ML injection Inject 0.4 mLs (40 mg total) into the skin daily. 30 days post op 30 mL 0  . lansoprazole (PREVACID) 30 MG capsule Take 1 capsule (30 mg total) by mouth daily as needed (GERD/heartburn). 90 capsule 3  . LORazepam (ATIVAN) 0.5 MG tablet Take 1 tablet (0.5 mg total) by mouth every 8 (eight) hours as needed for anxiety. 30 tablet 2  . metoprolol (LOPRESSOR) 50 MG tablet Take 1 tablet (50 mg total) by mouth 2 (two) times daily.    . Multiple Vitamin (MULTIVITAMIN WITH MINERALS) TABS Take 1 tablet by mouth every morning.     . pantoprazole (PROTONIX) 40 MG tablet Take 1  tablet (40 mg total) by mouth daily as needed. 90 tablet 3  . polyethylene glycol (MIRALAX / GLYCOLAX) packet Take 17 g by mouth 2 (two) times daily as needed for mild constipation.     No current facility-administered medications on file prior to visit.    BP 160/95 mmHg  Pulse 76  Temp(Src) 97.7 F (36.5 C) (Oral)  Resp 18  Ht 5\' 2"  (1.575 m)  Wt 82 lb (37.195 kg)  BMI 14.99 kg/m2      Review of Systems  Constitutional: Positive for activity change, appetite change and unexpected weight change.  HENT: Negative for congestion, dental problem, hearing loss, rhinorrhea, sinus pressure, sore throat and tinnitus.   Eyes: Negative for pain, discharge and visual disturbance.  Respiratory: Negative for cough and shortness of breath.   Cardiovascular: Negative for chest pain, palpitations and leg swelling.  Gastrointestinal: Negative for nausea, vomiting, abdominal pain, diarrhea, constipation, blood in stool and abdominal distention.  Genitourinary: Negative for dysuria, urgency, frequency, hematuria, flank pain, vaginal bleeding, vaginal discharge, difficulty urinating, vaginal pain and pelvic pain.  Musculoskeletal: Positive for arthralgias and gait problem. Negative for joint swelling.  Skin: Negative for rash.  Neurological: Positive for weakness. Negative for dizziness, syncope, speech difficulty, numbness and headaches.  Hematological: Negative for adenopathy.  Psychiatric/Behavioral: Positive for behavioral problems, sleep disturbance and decreased concentration. Negative for dysphoric mood and agitation. The patient is nervous/anxious.        Objective:   Physical Exam  Constitutional: She is oriented to person, place, and time. She appears well-developed and well-nourished.  Marked confusion Blood pressure is 155/95  Malnourished  HENT:  Head: Normocephalic.  Right Ear: External ear normal.  Left Ear: External ear normal.  Mouth/Throat: Oropharynx is clear and moist.    Eyes: Conjunctivae and EOM are normal. Pupils are equal, round, and reactive to light.  Neck: Normal range of motion. Neck supple. No thyromegaly present.  Cardiovascular: Normal rate, regular rhythm, normal heart sounds and intact distal pulses.   Pulmonary/Chest: Effort normal. She has rales.  Rales, right lower lobe  Abdominal: Soft. Bowel sounds are normal. She exhibits no mass. There is no tenderness.  Musculoskeletal: Normal range of motion. She exhibits no edema.  Lymphadenopathy:    She has no cervical adenopathy.  Neurological: She is alert and oriented to person, place, and time.  Skin: Skin is warm and dry. No rash noted.  Psychiatric: She has a normal mood and affect. Her behavior is normal.          Assessment & Plan:   Advanced dementia.  Will place a consult for home health services to assist with  patient care Malnutrition.  Discussed.  Will attempt to crush medications and add supplements Hypertension.  Poor control off medications.  Will discontinue furosemide, but otherwise resume her antihypertensive medications History of atrial fib, stable Unsteady gait.  Patient remains a high fall risk and has had a hip fracture approximately 11 months ago.  Prescription written for a light weight wheelchair to assist with ambulation and decrease injury, risk

## 2015-05-05 ENCOUNTER — Telehealth: Payer: Self-pay | Admitting: Internal Medicine

## 2015-05-05 NOTE — Telephone Encounter (Signed)
Daughter ask for a call back she would like to discuss her Mom getting a hospital bed

## 2015-05-06 NOTE — Telephone Encounter (Signed)
Spoke to [name] pt's daughter, told her just let Home Health know what you need and they will send orders over for Dr. Kirtland BouchardK. [name] verbalized understanding. Asked [name] if any has got in touch with her yet regarding home health. [name] said no. Told her they should be in touch with you soon if you do not hear from them by the end of the week please let me know. [name] verbalized understanding.

## 2015-05-09 DIAGNOSIS — I10 Essential (primary) hypertension: Secondary | ICD-10-CM | POA: Diagnosis not present

## 2015-05-09 DIAGNOSIS — R269 Unspecified abnormalities of gait and mobility: Secondary | ICD-10-CM | POA: Diagnosis not present

## 2015-05-09 DIAGNOSIS — F0391 Unspecified dementia with behavioral disturbance: Secondary | ICD-10-CM | POA: Diagnosis not present

## 2015-05-09 DIAGNOSIS — E46 Unspecified protein-calorie malnutrition: Secondary | ICD-10-CM | POA: Diagnosis not present

## 2015-05-10 DIAGNOSIS — E46 Unspecified protein-calorie malnutrition: Secondary | ICD-10-CM | POA: Diagnosis not present

## 2015-05-10 DIAGNOSIS — R269 Unspecified abnormalities of gait and mobility: Secondary | ICD-10-CM | POA: Diagnosis not present

## 2015-05-10 DIAGNOSIS — I10 Essential (primary) hypertension: Secondary | ICD-10-CM | POA: Diagnosis not present

## 2015-05-10 DIAGNOSIS — F0391 Unspecified dementia with behavioral disturbance: Secondary | ICD-10-CM | POA: Diagnosis not present

## 2015-05-14 DIAGNOSIS — F0391 Unspecified dementia with behavioral disturbance: Secondary | ICD-10-CM

## 2015-05-15 DIAGNOSIS — F0391 Unspecified dementia with behavioral disturbance: Secondary | ICD-10-CM | POA: Diagnosis not present

## 2015-05-15 DIAGNOSIS — E46 Unspecified protein-calorie malnutrition: Secondary | ICD-10-CM | POA: Diagnosis not present

## 2015-05-15 DIAGNOSIS — R269 Unspecified abnormalities of gait and mobility: Secondary | ICD-10-CM | POA: Diagnosis not present

## 2015-05-15 DIAGNOSIS — I10 Essential (primary) hypertension: Secondary | ICD-10-CM | POA: Diagnosis not present

## 2015-05-17 DIAGNOSIS — I10 Essential (primary) hypertension: Secondary | ICD-10-CM | POA: Diagnosis not present

## 2015-05-17 DIAGNOSIS — R269 Unspecified abnormalities of gait and mobility: Secondary | ICD-10-CM | POA: Diagnosis not present

## 2015-05-17 DIAGNOSIS — F0391 Unspecified dementia with behavioral disturbance: Secondary | ICD-10-CM | POA: Diagnosis not present

## 2015-05-17 DIAGNOSIS — E46 Unspecified protein-calorie malnutrition: Secondary | ICD-10-CM | POA: Diagnosis not present

## 2015-05-19 DIAGNOSIS — I10 Essential (primary) hypertension: Secondary | ICD-10-CM | POA: Diagnosis not present

## 2015-05-19 DIAGNOSIS — R269 Unspecified abnormalities of gait and mobility: Secondary | ICD-10-CM | POA: Diagnosis not present

## 2015-05-19 DIAGNOSIS — F0391 Unspecified dementia with behavioral disturbance: Secondary | ICD-10-CM | POA: Diagnosis not present

## 2015-05-19 DIAGNOSIS — E46 Unspecified protein-calorie malnutrition: Secondary | ICD-10-CM | POA: Diagnosis not present

## 2015-05-20 ENCOUNTER — Telehealth: Payer: Self-pay | Admitting: Internal Medicine

## 2015-05-20 DIAGNOSIS — F0391 Unspecified dementia with behavioral disturbance: Secondary | ICD-10-CM

## 2015-05-20 DIAGNOSIS — R296 Repeated falls: Secondary | ICD-10-CM

## 2015-05-20 DIAGNOSIS — F03918 Unspecified dementia, unspecified severity, with other behavioral disturbance: Secondary | ICD-10-CM

## 2015-05-20 DIAGNOSIS — R269 Unspecified abnormalities of gait and mobility: Secondary | ICD-10-CM

## 2015-05-20 DIAGNOSIS — E46 Unspecified protein-calorie malnutrition: Secondary | ICD-10-CM

## 2015-05-20 NOTE — Telephone Encounter (Signed)
Pt has fell out of bed three times and daughter would like hospital bed. Pt has [employer]/medicaid. Please find in network DME place to get bed

## 2015-05-21 DIAGNOSIS — F0391 Unspecified dementia with behavioral disturbance: Secondary | ICD-10-CM | POA: Diagnosis not present

## 2015-05-21 DIAGNOSIS — R269 Unspecified abnormalities of gait and mobility: Secondary | ICD-10-CM | POA: Diagnosis not present

## 2015-05-21 DIAGNOSIS — E46 Unspecified protein-calorie malnutrition: Secondary | ICD-10-CM | POA: Diagnosis not present

## 2015-05-21 DIAGNOSIS — I10 Essential (primary) hypertension: Secondary | ICD-10-CM | POA: Diagnosis not present

## 2015-05-22 DIAGNOSIS — E46 Unspecified protein-calorie malnutrition: Secondary | ICD-10-CM | POA: Diagnosis not present

## 2015-05-22 DIAGNOSIS — F0391 Unspecified dementia with behavioral disturbance: Secondary | ICD-10-CM | POA: Diagnosis not present

## 2015-05-22 DIAGNOSIS — R269 Unspecified abnormalities of gait and mobility: Secondary | ICD-10-CM | POA: Diagnosis not present

## 2015-05-22 DIAGNOSIS — I10 Essential (primary) hypertension: Secondary | ICD-10-CM | POA: Diagnosis not present

## 2015-05-22 NOTE — Telephone Encounter (Signed)
Spoke to [place], told her will put order in for Hospital bed and someone should contact her. [name] verbalized understanding. [facility]

## 2015-05-23 DIAGNOSIS — I4892 Unspecified atrial flutter: Secondary | ICD-10-CM | POA: Diagnosis not present

## 2015-05-23 DIAGNOSIS — I10 Essential (primary) hypertension: Secondary | ICD-10-CM | POA: Diagnosis not present

## 2015-05-23 DIAGNOSIS — R296 Repeated falls: Secondary | ICD-10-CM | POA: Diagnosis not present

## 2015-05-23 DIAGNOSIS — F0391 Unspecified dementia with behavioral disturbance: Secondary | ICD-10-CM | POA: Diagnosis not present

## 2015-05-23 DIAGNOSIS — E46 Unspecified protein-calorie malnutrition: Secondary | ICD-10-CM | POA: Diagnosis not present

## 2015-05-23 DIAGNOSIS — R269 Unspecified abnormalities of gait and mobility: Secondary | ICD-10-CM | POA: Diagnosis not present

## 2015-05-23 NOTE — Progress Notes (Signed)
   Subjective:    Patient ID: Cynthia Graham, female    DOB: 23-Jul-1917, 81 y.o.   MRN: [id number]  HPI  Mrs. Rennis Hardingllis has severe dementia complicated by swallowing difficulties, which requires her to be positioned  in ways  not feasible with a normal bed.  Her head must be elevated at least 30 to minimize risk of aspiration and subsequent pneumonia.  Review of Systems     Objective:   Physical Exam        Assessment & Plan:

## 2015-05-23 NOTE — Telephone Encounter (Signed)
Spoke to pt's daughter Ruel FavorsSabra, asked her if she got a wheelchair yet for pt? She stated no. Told her [facility] to [facility] the bed. [name] verbalized understanding. Told her someone will be in contact with her. [name] verbalized understanding. Order for [employer] Wheelchair done.   [facility] notified order for Bed and Wheelchair complete and note addendum done. Melissa verbalized understanding.

## 2015-05-24 DIAGNOSIS — E46 Unspecified protein-calorie malnutrition: Secondary | ICD-10-CM | POA: Diagnosis not present

## 2015-05-24 DIAGNOSIS — I10 Essential (primary) hypertension: Secondary | ICD-10-CM | POA: Diagnosis not present

## 2015-05-24 DIAGNOSIS — F0391 Unspecified dementia with behavioral disturbance: Secondary | ICD-10-CM | POA: Diagnosis not present

## 2015-05-24 DIAGNOSIS — R269 Unspecified abnormalities of gait and mobility: Secondary | ICD-10-CM | POA: Diagnosis not present

## 2015-05-27 DIAGNOSIS — E46 Unspecified protein-calorie malnutrition: Secondary | ICD-10-CM | POA: Diagnosis not present

## 2015-05-27 DIAGNOSIS — I10 Essential (primary) hypertension: Secondary | ICD-10-CM | POA: Diagnosis not present

## 2015-05-27 DIAGNOSIS — R269 Unspecified abnormalities of gait and mobility: Secondary | ICD-10-CM | POA: Diagnosis not present

## 2015-05-27 DIAGNOSIS — F0391 Unspecified dementia with behavioral disturbance: Secondary | ICD-10-CM | POA: Diagnosis not present

## 2015-05-28 DIAGNOSIS — F0391 Unspecified dementia with behavioral disturbance: Secondary | ICD-10-CM | POA: Diagnosis not present

## 2015-05-28 DIAGNOSIS — E46 Unspecified protein-calorie malnutrition: Secondary | ICD-10-CM | POA: Diagnosis not present

## 2015-05-28 DIAGNOSIS — I10 Essential (primary) hypertension: Secondary | ICD-10-CM | POA: Diagnosis not present

## 2015-05-28 DIAGNOSIS — R269 Unspecified abnormalities of gait and mobility: Secondary | ICD-10-CM | POA: Diagnosis not present

## 2015-05-29 DIAGNOSIS — E46 Unspecified protein-calorie malnutrition: Secondary | ICD-10-CM | POA: Diagnosis not present

## 2015-05-29 DIAGNOSIS — I10 Essential (primary) hypertension: Secondary | ICD-10-CM | POA: Diagnosis not present

## 2015-05-29 DIAGNOSIS — R269 Unspecified abnormalities of gait and mobility: Secondary | ICD-10-CM | POA: Diagnosis not present

## 2015-05-29 DIAGNOSIS — F0391 Unspecified dementia with behavioral disturbance: Secondary | ICD-10-CM | POA: Diagnosis not present

## 2015-05-31 DIAGNOSIS — E46 Unspecified protein-calorie malnutrition: Secondary | ICD-10-CM | POA: Diagnosis not present

## 2015-05-31 DIAGNOSIS — I10 Essential (primary) hypertension: Secondary | ICD-10-CM | POA: Diagnosis not present

## 2015-05-31 DIAGNOSIS — R269 Unspecified abnormalities of gait and mobility: Secondary | ICD-10-CM | POA: Diagnosis not present

## 2015-05-31 DIAGNOSIS — F0391 Unspecified dementia with behavioral disturbance: Secondary | ICD-10-CM | POA: Diagnosis not present

## 2015-06-02 DIAGNOSIS — F0391 Unspecified dementia with behavioral disturbance: Secondary | ICD-10-CM | POA: Diagnosis not present

## 2015-06-02 DIAGNOSIS — R269 Unspecified abnormalities of gait and mobility: Secondary | ICD-10-CM | POA: Diagnosis not present

## 2015-06-02 DIAGNOSIS — E46 Unspecified protein-calorie malnutrition: Secondary | ICD-10-CM | POA: Diagnosis not present

## 2015-06-02 DIAGNOSIS — I10 Essential (primary) hypertension: Secondary | ICD-10-CM | POA: Diagnosis not present

## 2015-06-03 DIAGNOSIS — R269 Unspecified abnormalities of gait and mobility: Secondary | ICD-10-CM | POA: Diagnosis not present

## 2015-06-03 DIAGNOSIS — E46 Unspecified protein-calorie malnutrition: Secondary | ICD-10-CM | POA: Diagnosis not present

## 2015-06-03 DIAGNOSIS — F0391 Unspecified dementia with behavioral disturbance: Secondary | ICD-10-CM | POA: Diagnosis not present

## 2015-06-03 DIAGNOSIS — I10 Essential (primary) hypertension: Secondary | ICD-10-CM | POA: Diagnosis not present

## 2015-06-04 DIAGNOSIS — F0391 Unspecified dementia with behavioral disturbance: Secondary | ICD-10-CM | POA: Diagnosis not present

## 2015-06-04 DIAGNOSIS — I10 Essential (primary) hypertension: Secondary | ICD-10-CM | POA: Diagnosis not present

## 2015-06-04 DIAGNOSIS — R269 Unspecified abnormalities of gait and mobility: Secondary | ICD-10-CM | POA: Diagnosis not present

## 2015-06-04 DIAGNOSIS — E46 Unspecified protein-calorie malnutrition: Secondary | ICD-10-CM | POA: Diagnosis not present

## 2015-06-05 DIAGNOSIS — R269 Unspecified abnormalities of gait and mobility: Secondary | ICD-10-CM | POA: Diagnosis not present

## 2015-06-05 DIAGNOSIS — F0391 Unspecified dementia with behavioral disturbance: Secondary | ICD-10-CM | POA: Diagnosis not present

## 2015-06-05 DIAGNOSIS — E46 Unspecified protein-calorie malnutrition: Secondary | ICD-10-CM | POA: Diagnosis not present

## 2015-06-05 DIAGNOSIS — I10 Essential (primary) hypertension: Secondary | ICD-10-CM | POA: Diagnosis not present

## 2015-06-06 DIAGNOSIS — E46 Unspecified protein-calorie malnutrition: Secondary | ICD-10-CM | POA: Diagnosis not present

## 2015-06-06 DIAGNOSIS — I10 Essential (primary) hypertension: Secondary | ICD-10-CM | POA: Diagnosis not present

## 2015-06-06 DIAGNOSIS — R269 Unspecified abnormalities of gait and mobility: Secondary | ICD-10-CM | POA: Diagnosis not present

## 2015-06-06 DIAGNOSIS — F0391 Unspecified dementia with behavioral disturbance: Secondary | ICD-10-CM | POA: Diagnosis not present

## 2015-06-07 DIAGNOSIS — I10 Essential (primary) hypertension: Secondary | ICD-10-CM | POA: Diagnosis not present

## 2015-06-07 DIAGNOSIS — R269 Unspecified abnormalities of gait and mobility: Secondary | ICD-10-CM | POA: Diagnosis not present

## 2015-06-07 DIAGNOSIS — F0391 Unspecified dementia with behavioral disturbance: Secondary | ICD-10-CM | POA: Diagnosis not present

## 2015-06-07 DIAGNOSIS — E46 Unspecified protein-calorie malnutrition: Secondary | ICD-10-CM | POA: Diagnosis not present

## 2015-06-09 DIAGNOSIS — E46 Unspecified protein-calorie malnutrition: Secondary | ICD-10-CM | POA: Diagnosis not present

## 2015-06-09 DIAGNOSIS — F0391 Unspecified dementia with behavioral disturbance: Secondary | ICD-10-CM | POA: Diagnosis not present

## 2015-06-09 DIAGNOSIS — R269 Unspecified abnormalities of gait and mobility: Secondary | ICD-10-CM | POA: Diagnosis not present

## 2015-06-09 DIAGNOSIS — I10 Essential (primary) hypertension: Secondary | ICD-10-CM | POA: Diagnosis not present

## 2015-06-10 DIAGNOSIS — F0391 Unspecified dementia with behavioral disturbance: Secondary | ICD-10-CM | POA: Diagnosis not present

## 2015-06-10 DIAGNOSIS — R269 Unspecified abnormalities of gait and mobility: Secondary | ICD-10-CM | POA: Diagnosis not present

## 2015-06-10 DIAGNOSIS — E46 Unspecified protein-calorie malnutrition: Secondary | ICD-10-CM | POA: Diagnosis not present

## 2015-06-10 DIAGNOSIS — I10 Essential (primary) hypertension: Secondary | ICD-10-CM | POA: Diagnosis not present

## 2015-06-11 DIAGNOSIS — R269 Unspecified abnormalities of gait and mobility: Secondary | ICD-10-CM | POA: Diagnosis not present

## 2015-06-11 DIAGNOSIS — F0391 Unspecified dementia with behavioral disturbance: Secondary | ICD-10-CM | POA: Diagnosis not present

## 2015-06-11 DIAGNOSIS — E46 Unspecified protein-calorie malnutrition: Secondary | ICD-10-CM | POA: Diagnosis not present

## 2015-06-11 DIAGNOSIS — I10 Essential (primary) hypertension: Secondary | ICD-10-CM | POA: Diagnosis not present

## 2015-06-13 DIAGNOSIS — R269 Unspecified abnormalities of gait and mobility: Secondary | ICD-10-CM | POA: Diagnosis not present

## 2015-06-13 DIAGNOSIS — I10 Essential (primary) hypertension: Secondary | ICD-10-CM | POA: Diagnosis not present

## 2015-06-13 DIAGNOSIS — F0391 Unspecified dementia with behavioral disturbance: Secondary | ICD-10-CM | POA: Diagnosis not present

## 2015-06-13 DIAGNOSIS — E46 Unspecified protein-calorie malnutrition: Secondary | ICD-10-CM | POA: Diagnosis not present

## 2015-06-14 DIAGNOSIS — R269 Unspecified abnormalities of gait and mobility: Secondary | ICD-10-CM | POA: Diagnosis not present

## 2015-06-14 DIAGNOSIS — F0391 Unspecified dementia with behavioral disturbance: Secondary | ICD-10-CM | POA: Diagnosis not present

## 2015-06-14 DIAGNOSIS — I10 Essential (primary) hypertension: Secondary | ICD-10-CM | POA: Diagnosis not present

## 2015-06-14 DIAGNOSIS — E46 Unspecified protein-calorie malnutrition: Secondary | ICD-10-CM | POA: Diagnosis not present

## 2015-06-16 DIAGNOSIS — R269 Unspecified abnormalities of gait and mobility: Secondary | ICD-10-CM | POA: Diagnosis not present

## 2015-06-16 DIAGNOSIS — F0391 Unspecified dementia with behavioral disturbance: Secondary | ICD-10-CM | POA: Diagnosis not present

## 2015-06-16 DIAGNOSIS — I10 Essential (primary) hypertension: Secondary | ICD-10-CM | POA: Diagnosis not present

## 2015-06-16 DIAGNOSIS — E46 Unspecified protein-calorie malnutrition: Secondary | ICD-10-CM | POA: Diagnosis not present

## 2015-06-17 DIAGNOSIS — E46 Unspecified protein-calorie malnutrition: Secondary | ICD-10-CM | POA: Diagnosis not present

## 2015-06-17 DIAGNOSIS — F0391 Unspecified dementia with behavioral disturbance: Secondary | ICD-10-CM | POA: Diagnosis not present

## 2015-06-17 DIAGNOSIS — R269 Unspecified abnormalities of gait and mobility: Secondary | ICD-10-CM | POA: Diagnosis not present

## 2015-06-17 DIAGNOSIS — I10 Essential (primary) hypertension: Secondary | ICD-10-CM | POA: Diagnosis not present

## 2015-06-18 DIAGNOSIS — F0391 Unspecified dementia with behavioral disturbance: Secondary | ICD-10-CM | POA: Diagnosis not present

## 2015-06-18 DIAGNOSIS — E46 Unspecified protein-calorie malnutrition: Secondary | ICD-10-CM | POA: Diagnosis not present

## 2015-06-18 DIAGNOSIS — R269 Unspecified abnormalities of gait and mobility: Secondary | ICD-10-CM | POA: Diagnosis not present

## 2015-06-18 DIAGNOSIS — I10 Essential (primary) hypertension: Secondary | ICD-10-CM | POA: Diagnosis not present

## 2015-06-19 DIAGNOSIS — R269 Unspecified abnormalities of gait and mobility: Secondary | ICD-10-CM | POA: Diagnosis not present

## 2015-06-19 DIAGNOSIS — I10 Essential (primary) hypertension: Secondary | ICD-10-CM | POA: Diagnosis not present

## 2015-06-19 DIAGNOSIS — E46 Unspecified protein-calorie malnutrition: Secondary | ICD-10-CM | POA: Diagnosis not present

## 2015-06-19 DIAGNOSIS — F0391 Unspecified dementia with behavioral disturbance: Secondary | ICD-10-CM | POA: Diagnosis not present

## 2015-06-20 DIAGNOSIS — I10 Essential (primary) hypertension: Secondary | ICD-10-CM | POA: Diagnosis not present

## 2015-06-20 DIAGNOSIS — E46 Unspecified protein-calorie malnutrition: Secondary | ICD-10-CM | POA: Diagnosis not present

## 2015-06-20 DIAGNOSIS — R269 Unspecified abnormalities of gait and mobility: Secondary | ICD-10-CM | POA: Diagnosis not present

## 2015-06-20 DIAGNOSIS — F0391 Unspecified dementia with behavioral disturbance: Secondary | ICD-10-CM | POA: Diagnosis not present

## 2015-06-21 DIAGNOSIS — I10 Essential (primary) hypertension: Secondary | ICD-10-CM | POA: Diagnosis not present

## 2015-06-21 DIAGNOSIS — R269 Unspecified abnormalities of gait and mobility: Secondary | ICD-10-CM | POA: Diagnosis not present

## 2015-06-21 DIAGNOSIS — F0391 Unspecified dementia with behavioral disturbance: Secondary | ICD-10-CM | POA: Diagnosis not present

## 2015-06-21 DIAGNOSIS — E46 Unspecified protein-calorie malnutrition: Secondary | ICD-10-CM | POA: Diagnosis not present

## 2015-06-23 DIAGNOSIS — R269 Unspecified abnormalities of gait and mobility: Secondary | ICD-10-CM | POA: Diagnosis not present

## 2015-06-23 DIAGNOSIS — R296 Repeated falls: Secondary | ICD-10-CM | POA: Diagnosis not present

## 2015-06-23 DIAGNOSIS — E46 Unspecified protein-calorie malnutrition: Secondary | ICD-10-CM | POA: Diagnosis not present

## 2015-06-23 DIAGNOSIS — I10 Essential (primary) hypertension: Secondary | ICD-10-CM | POA: Diagnosis not present

## 2015-06-23 DIAGNOSIS — F0391 Unspecified dementia with behavioral disturbance: Secondary | ICD-10-CM | POA: Diagnosis not present

## 2015-06-24 ENCOUNTER — Telehealth: Payer: Self-pay | Admitting: Internal Medicine

## 2015-06-24 DIAGNOSIS — I10 Essential (primary) hypertension: Secondary | ICD-10-CM | POA: Diagnosis not present

## 2015-06-24 DIAGNOSIS — R269 Unspecified abnormalities of gait and mobility: Secondary | ICD-10-CM | POA: Diagnosis not present

## 2015-06-24 DIAGNOSIS — F0391 Unspecified dementia with behavioral disturbance: Secondary | ICD-10-CM | POA: Diagnosis not present

## 2015-06-24 DIAGNOSIS — E46 Unspecified protein-calorie malnutrition: Secondary | ICD-10-CM | POA: Diagnosis not present

## 2015-06-24 NOTE — Telephone Encounter (Signed)
Please see message and advise 

## 2015-06-24 NOTE — Telephone Encounter (Signed)
Pt daughter call to say that Mom is not sleeping. Said that  LORazepam (ATIVAN) 0.5 MG tablet is not working . She is asking if there is something else she can try.  Daughter would like a call back

## 2015-06-25 DIAGNOSIS — F0391 Unspecified dementia with behavioral disturbance: Secondary | ICD-10-CM | POA: Diagnosis not present

## 2015-06-25 DIAGNOSIS — I10 Essential (primary) hypertension: Secondary | ICD-10-CM | POA: Diagnosis not present

## 2015-06-25 DIAGNOSIS — E46 Unspecified protein-calorie malnutrition: Secondary | ICD-10-CM | POA: Diagnosis not present

## 2015-06-25 DIAGNOSIS — R269 Unspecified abnormalities of gait and mobility: Secondary | ICD-10-CM | POA: Diagnosis not present

## 2015-06-25 MED ORDER — TRAZODONE HCL 50 MG PO TABS: 25.0000 mg | ORAL_TABLET | Freq: Every evening | ORAL | Status: AC | PRN

## 2015-06-25 NOTE — Telephone Encounter (Signed)
Trazodone 25 mg #60 one at bedtime as needed for sleep, refill times 2

## 2015-06-25 NOTE — Telephone Encounter (Signed)
Spoke to [name] pt's daughter, told her Dr.K ordered Trazodone 25 mg at bedtime, you will have to cut the tablets in half cause it only comes in 50 mg tablet. Rx sent to pharmacy. [name] verbalized understanding.

## 2015-06-27 DIAGNOSIS — F0391 Unspecified dementia with behavioral disturbance: Secondary | ICD-10-CM | POA: Diagnosis not present

## 2015-06-27 DIAGNOSIS — R269 Unspecified abnormalities of gait and mobility: Secondary | ICD-10-CM | POA: Diagnosis not present

## 2015-06-27 DIAGNOSIS — I10 Essential (primary) hypertension: Secondary | ICD-10-CM | POA: Diagnosis not present

## 2015-06-27 DIAGNOSIS — E46 Unspecified protein-calorie malnutrition: Secondary | ICD-10-CM | POA: Diagnosis not present

## 2015-06-28 DIAGNOSIS — F0391 Unspecified dementia with behavioral disturbance: Secondary | ICD-10-CM | POA: Diagnosis not present

## 2015-06-28 DIAGNOSIS — R269 Unspecified abnormalities of gait and mobility: Secondary | ICD-10-CM | POA: Diagnosis not present

## 2015-06-28 DIAGNOSIS — I10 Essential (primary) hypertension: Secondary | ICD-10-CM | POA: Diagnosis not present

## 2015-06-28 DIAGNOSIS — E46 Unspecified protein-calorie malnutrition: Secondary | ICD-10-CM | POA: Diagnosis not present

## 2015-07-01 DIAGNOSIS — R269 Unspecified abnormalities of gait and mobility: Secondary | ICD-10-CM | POA: Diagnosis not present

## 2015-07-01 DIAGNOSIS — F0391 Unspecified dementia with behavioral disturbance: Secondary | ICD-10-CM | POA: Diagnosis not present

## 2015-07-01 DIAGNOSIS — I10 Essential (primary) hypertension: Secondary | ICD-10-CM | POA: Diagnosis not present

## 2015-07-01 DIAGNOSIS — E46 Unspecified protein-calorie malnutrition: Secondary | ICD-10-CM | POA: Diagnosis not present

## 2015-07-02 DIAGNOSIS — I10 Essential (primary) hypertension: Secondary | ICD-10-CM | POA: Diagnosis not present

## 2015-07-02 DIAGNOSIS — E46 Unspecified protein-calorie malnutrition: Secondary | ICD-10-CM | POA: Diagnosis not present

## 2015-07-02 DIAGNOSIS — R269 Unspecified abnormalities of gait and mobility: Secondary | ICD-10-CM | POA: Diagnosis not present

## 2015-07-02 DIAGNOSIS — F0391 Unspecified dementia with behavioral disturbance: Secondary | ICD-10-CM | POA: Diagnosis not present

## 2015-07-03 DIAGNOSIS — F0391 Unspecified dementia with behavioral disturbance: Secondary | ICD-10-CM | POA: Diagnosis not present

## 2015-07-03 DIAGNOSIS — E46 Unspecified protein-calorie malnutrition: Secondary | ICD-10-CM | POA: Diagnosis not present

## 2015-07-03 DIAGNOSIS — R269 Unspecified abnormalities of gait and mobility: Secondary | ICD-10-CM | POA: Diagnosis not present

## 2015-07-03 DIAGNOSIS — I10 Essential (primary) hypertension: Secondary | ICD-10-CM | POA: Diagnosis not present

## 2015-07-05 DIAGNOSIS — E46 Unspecified protein-calorie malnutrition: Secondary | ICD-10-CM | POA: Diagnosis not present

## 2015-07-05 DIAGNOSIS — F0391 Unspecified dementia with behavioral disturbance: Secondary | ICD-10-CM | POA: Diagnosis not present

## 2015-07-05 DIAGNOSIS — I10 Essential (primary) hypertension: Secondary | ICD-10-CM | POA: Diagnosis not present

## 2015-07-05 DIAGNOSIS — M6281 Muscle weakness (generalized): Secondary | ICD-10-CM | POA: Diagnosis not present

## 2015-07-05 DIAGNOSIS — R269 Unspecified abnormalities of gait and mobility: Secondary | ICD-10-CM | POA: Diagnosis not present

## 2015-07-07 DIAGNOSIS — M6281 Muscle weakness (generalized): Secondary | ICD-10-CM | POA: Diagnosis not present

## 2015-07-07 DIAGNOSIS — F0391 Unspecified dementia with behavioral disturbance: Secondary | ICD-10-CM | POA: Diagnosis not present

## 2015-07-07 DIAGNOSIS — E46 Unspecified protein-calorie malnutrition: Secondary | ICD-10-CM | POA: Diagnosis not present

## 2015-07-07 DIAGNOSIS — I10 Essential (primary) hypertension: Secondary | ICD-10-CM | POA: Diagnosis not present

## 2015-07-07 DIAGNOSIS — R269 Unspecified abnormalities of gait and mobility: Secondary | ICD-10-CM | POA: Diagnosis not present

## 2015-07-08 DIAGNOSIS — E46 Unspecified protein-calorie malnutrition: Secondary | ICD-10-CM | POA: Diagnosis not present

## 2015-07-08 DIAGNOSIS — F0391 Unspecified dementia with behavioral disturbance: Secondary | ICD-10-CM | POA: Diagnosis not present

## 2015-07-08 DIAGNOSIS — R269 Unspecified abnormalities of gait and mobility: Secondary | ICD-10-CM | POA: Diagnosis not present

## 2015-07-08 DIAGNOSIS — M6281 Muscle weakness (generalized): Secondary | ICD-10-CM | POA: Diagnosis not present

## 2015-07-08 DIAGNOSIS — I10 Essential (primary) hypertension: Secondary | ICD-10-CM | POA: Diagnosis not present

## 2015-07-10 DIAGNOSIS — I10 Essential (primary) hypertension: Secondary | ICD-10-CM | POA: Diagnosis not present

## 2015-07-10 DIAGNOSIS — M6281 Muscle weakness (generalized): Secondary | ICD-10-CM | POA: Diagnosis not present

## 2015-07-10 DIAGNOSIS — E46 Unspecified protein-calorie malnutrition: Secondary | ICD-10-CM | POA: Diagnosis not present

## 2015-07-10 DIAGNOSIS — R269 Unspecified abnormalities of gait and mobility: Secondary | ICD-10-CM | POA: Diagnosis not present

## 2015-07-10 DIAGNOSIS — F0391 Unspecified dementia with behavioral disturbance: Secondary | ICD-10-CM | POA: Diagnosis not present

## 2015-07-12 DIAGNOSIS — F0391 Unspecified dementia with behavioral disturbance: Secondary | ICD-10-CM | POA: Diagnosis not present

## 2015-07-12 DIAGNOSIS — I10 Essential (primary) hypertension: Secondary | ICD-10-CM | POA: Diagnosis not present

## 2015-07-12 DIAGNOSIS — R269 Unspecified abnormalities of gait and mobility: Secondary | ICD-10-CM | POA: Diagnosis not present

## 2015-07-12 DIAGNOSIS — E46 Unspecified protein-calorie malnutrition: Secondary | ICD-10-CM | POA: Diagnosis not present

## 2015-07-12 DIAGNOSIS — M6281 Muscle weakness (generalized): Secondary | ICD-10-CM | POA: Diagnosis not present

## 2015-07-16 DIAGNOSIS — E46 Unspecified protein-calorie malnutrition: Secondary | ICD-10-CM

## 2015-07-16 DIAGNOSIS — R269 Unspecified abnormalities of gait and mobility: Secondary | ICD-10-CM

## 2015-07-16 DIAGNOSIS — F0391 Unspecified dementia with behavioral disturbance: Secondary | ICD-10-CM

## 2015-07-16 DIAGNOSIS — I10 Essential (primary) hypertension: Secondary | ICD-10-CM

## 2015-07-17 DIAGNOSIS — I10 Essential (primary) hypertension: Secondary | ICD-10-CM | POA: Diagnosis not present

## 2015-07-17 DIAGNOSIS — M6281 Muscle weakness (generalized): Secondary | ICD-10-CM | POA: Diagnosis not present

## 2015-07-17 DIAGNOSIS — F0391 Unspecified dementia with behavioral disturbance: Secondary | ICD-10-CM | POA: Diagnosis not present

## 2015-07-17 DIAGNOSIS — R269 Unspecified abnormalities of gait and mobility: Secondary | ICD-10-CM | POA: Diagnosis not present

## 2015-07-17 DIAGNOSIS — E46 Unspecified protein-calorie malnutrition: Secondary | ICD-10-CM | POA: Diagnosis not present

## 2015-07-19 DIAGNOSIS — E46 Unspecified protein-calorie malnutrition: Secondary | ICD-10-CM | POA: Diagnosis not present

## 2015-07-19 DIAGNOSIS — F0391 Unspecified dementia with behavioral disturbance: Secondary | ICD-10-CM | POA: Diagnosis not present

## 2015-07-19 DIAGNOSIS — I10 Essential (primary) hypertension: Secondary | ICD-10-CM | POA: Diagnosis not present

## 2015-07-19 DIAGNOSIS — M6281 Muscle weakness (generalized): Secondary | ICD-10-CM | POA: Diagnosis not present

## 2015-07-19 DIAGNOSIS — R269 Unspecified abnormalities of gait and mobility: Secondary | ICD-10-CM | POA: Diagnosis not present

## 2015-07-23 DIAGNOSIS — R296 Repeated falls: Secondary | ICD-10-CM | POA: Diagnosis not present

## 2015-07-25 DIAGNOSIS — E46 Unspecified protein-calorie malnutrition: Secondary | ICD-10-CM | POA: Diagnosis not present

## 2015-07-25 DIAGNOSIS — M6281 Muscle weakness (generalized): Secondary | ICD-10-CM | POA: Diagnosis not present

## 2015-07-25 DIAGNOSIS — I10 Essential (primary) hypertension: Secondary | ICD-10-CM | POA: Diagnosis not present

## 2015-07-25 DIAGNOSIS — F0391 Unspecified dementia with behavioral disturbance: Secondary | ICD-10-CM | POA: Diagnosis not present

## 2015-07-25 DIAGNOSIS — R269 Unspecified abnormalities of gait and mobility: Secondary | ICD-10-CM | POA: Diagnosis not present

## 2015-07-26 DIAGNOSIS — I10 Essential (primary) hypertension: Secondary | ICD-10-CM | POA: Diagnosis not present

## 2015-07-26 DIAGNOSIS — F0391 Unspecified dementia with behavioral disturbance: Secondary | ICD-10-CM | POA: Diagnosis not present

## 2015-07-26 DIAGNOSIS — E46 Unspecified protein-calorie malnutrition: Secondary | ICD-10-CM | POA: Diagnosis not present

## 2015-07-26 DIAGNOSIS — M6281 Muscle weakness (generalized): Secondary | ICD-10-CM | POA: Diagnosis not present

## 2015-07-26 DIAGNOSIS — R269 Unspecified abnormalities of gait and mobility: Secondary | ICD-10-CM | POA: Diagnosis not present

## 2015-07-30 DIAGNOSIS — M6281 Muscle weakness (generalized): Secondary | ICD-10-CM | POA: Diagnosis not present

## 2015-07-30 DIAGNOSIS — E46 Unspecified protein-calorie malnutrition: Secondary | ICD-10-CM | POA: Diagnosis not present

## 2015-07-30 DIAGNOSIS — R269 Unspecified abnormalities of gait and mobility: Secondary | ICD-10-CM | POA: Diagnosis not present

## 2015-07-30 DIAGNOSIS — F0391 Unspecified dementia with behavioral disturbance: Secondary | ICD-10-CM | POA: Diagnosis not present

## 2015-07-30 DIAGNOSIS — I10 Essential (primary) hypertension: Secondary | ICD-10-CM | POA: Diagnosis not present

## 2015-07-31 DIAGNOSIS — E46 Unspecified protein-calorie malnutrition: Secondary | ICD-10-CM | POA: Diagnosis not present

## 2015-07-31 DIAGNOSIS — F0391 Unspecified dementia with behavioral disturbance: Secondary | ICD-10-CM | POA: Diagnosis not present

## 2015-07-31 DIAGNOSIS — R269 Unspecified abnormalities of gait and mobility: Secondary | ICD-10-CM | POA: Diagnosis not present

## 2015-07-31 DIAGNOSIS — M6281 Muscle weakness (generalized): Secondary | ICD-10-CM | POA: Diagnosis not present

## 2015-07-31 DIAGNOSIS — I10 Essential (primary) hypertension: Secondary | ICD-10-CM | POA: Diagnosis not present

## 2015-08-01 DIAGNOSIS — I10 Essential (primary) hypertension: Secondary | ICD-10-CM | POA: Diagnosis not present

## 2015-08-01 DIAGNOSIS — M6281 Muscle weakness (generalized): Secondary | ICD-10-CM | POA: Diagnosis not present

## 2015-08-01 DIAGNOSIS — R269 Unspecified abnormalities of gait and mobility: Secondary | ICD-10-CM | POA: Diagnosis not present

## 2015-08-01 DIAGNOSIS — F0391 Unspecified dementia with behavioral disturbance: Secondary | ICD-10-CM | POA: Diagnosis not present

## 2015-08-01 DIAGNOSIS — E46 Unspecified protein-calorie malnutrition: Secondary | ICD-10-CM | POA: Diagnosis not present

## 2015-08-02 DIAGNOSIS — I10 Essential (primary) hypertension: Secondary | ICD-10-CM | POA: Diagnosis not present

## 2015-08-02 DIAGNOSIS — M6281 Muscle weakness (generalized): Secondary | ICD-10-CM | POA: Diagnosis not present

## 2015-08-02 DIAGNOSIS — R269 Unspecified abnormalities of gait and mobility: Secondary | ICD-10-CM | POA: Diagnosis not present

## 2015-08-02 DIAGNOSIS — E46 Unspecified protein-calorie malnutrition: Secondary | ICD-10-CM | POA: Diagnosis not present

## 2015-08-02 DIAGNOSIS — F0391 Unspecified dementia with behavioral disturbance: Secondary | ICD-10-CM | POA: Diagnosis not present

## 2015-08-04 DIAGNOSIS — I10 Essential (primary) hypertension: Secondary | ICD-10-CM | POA: Diagnosis not present

## 2015-08-04 DIAGNOSIS — F0391 Unspecified dementia with behavioral disturbance: Secondary | ICD-10-CM | POA: Diagnosis not present

## 2015-08-04 DIAGNOSIS — M6281 Muscle weakness (generalized): Secondary | ICD-10-CM | POA: Diagnosis not present

## 2015-08-04 DIAGNOSIS — R269 Unspecified abnormalities of gait and mobility: Secondary | ICD-10-CM | POA: Diagnosis not present

## 2015-08-04 DIAGNOSIS — E46 Unspecified protein-calorie malnutrition: Secondary | ICD-10-CM | POA: Diagnosis not present

## 2015-08-05 DIAGNOSIS — R269 Unspecified abnormalities of gait and mobility: Secondary | ICD-10-CM | POA: Diagnosis not present

## 2015-08-05 DIAGNOSIS — M6281 Muscle weakness (generalized): Secondary | ICD-10-CM | POA: Diagnosis not present

## 2015-08-05 DIAGNOSIS — I10 Essential (primary) hypertension: Secondary | ICD-10-CM | POA: Diagnosis not present

## 2015-08-05 DIAGNOSIS — E46 Unspecified protein-calorie malnutrition: Secondary | ICD-10-CM | POA: Diagnosis not present

## 2015-08-05 DIAGNOSIS — F0391 Unspecified dementia with behavioral disturbance: Secondary | ICD-10-CM | POA: Diagnosis not present

## 2015-08-06 DIAGNOSIS — R269 Unspecified abnormalities of gait and mobility: Secondary | ICD-10-CM | POA: Diagnosis not present

## 2015-08-06 DIAGNOSIS — M6281 Muscle weakness (generalized): Secondary | ICD-10-CM | POA: Diagnosis not present

## 2015-08-06 DIAGNOSIS — F0391 Unspecified dementia with behavioral disturbance: Secondary | ICD-10-CM | POA: Diagnosis not present

## 2015-08-06 DIAGNOSIS — I10 Essential (primary) hypertension: Secondary | ICD-10-CM | POA: Diagnosis not present

## 2015-08-06 DIAGNOSIS — E46 Unspecified protein-calorie malnutrition: Secondary | ICD-10-CM | POA: Diagnosis not present

## 2015-08-07 DIAGNOSIS — I10 Essential (primary) hypertension: Secondary | ICD-10-CM | POA: Diagnosis not present

## 2015-08-07 DIAGNOSIS — M6281 Muscle weakness (generalized): Secondary | ICD-10-CM | POA: Diagnosis not present

## 2015-08-07 DIAGNOSIS — R269 Unspecified abnormalities of gait and mobility: Secondary | ICD-10-CM | POA: Diagnosis not present

## 2015-08-07 DIAGNOSIS — E46 Unspecified protein-calorie malnutrition: Secondary | ICD-10-CM | POA: Diagnosis not present

## 2015-08-07 DIAGNOSIS — F0391 Unspecified dementia with behavioral disturbance: Secondary | ICD-10-CM | POA: Diagnosis not present

## 2015-08-08 DIAGNOSIS — I10 Essential (primary) hypertension: Secondary | ICD-10-CM | POA: Diagnosis not present

## 2015-08-08 DIAGNOSIS — F0391 Unspecified dementia with behavioral disturbance: Secondary | ICD-10-CM | POA: Diagnosis not present

## 2015-08-08 DIAGNOSIS — R269 Unspecified abnormalities of gait and mobility: Secondary | ICD-10-CM | POA: Diagnosis not present

## 2015-08-08 DIAGNOSIS — M6281 Muscle weakness (generalized): Secondary | ICD-10-CM | POA: Diagnosis not present

## 2015-08-08 DIAGNOSIS — E46 Unspecified protein-calorie malnutrition: Secondary | ICD-10-CM | POA: Diagnosis not present

## 2015-08-09 DIAGNOSIS — I10 Essential (primary) hypertension: Secondary | ICD-10-CM | POA: Diagnosis not present

## 2015-08-09 DIAGNOSIS — E46 Unspecified protein-calorie malnutrition: Secondary | ICD-10-CM | POA: Diagnosis not present

## 2015-08-09 DIAGNOSIS — F0391 Unspecified dementia with behavioral disturbance: Secondary | ICD-10-CM | POA: Diagnosis not present

## 2015-08-09 DIAGNOSIS — R269 Unspecified abnormalities of gait and mobility: Secondary | ICD-10-CM | POA: Diagnosis not present

## 2015-08-09 DIAGNOSIS — M6281 Muscle weakness (generalized): Secondary | ICD-10-CM | POA: Diagnosis not present

## 2015-08-11 DIAGNOSIS — F0391 Unspecified dementia with behavioral disturbance: Secondary | ICD-10-CM | POA: Diagnosis not present

## 2015-08-11 DIAGNOSIS — R269 Unspecified abnormalities of gait and mobility: Secondary | ICD-10-CM | POA: Diagnosis not present

## 2015-08-11 DIAGNOSIS — I10 Essential (primary) hypertension: Secondary | ICD-10-CM | POA: Diagnosis not present

## 2015-08-11 DIAGNOSIS — E46 Unspecified protein-calorie malnutrition: Secondary | ICD-10-CM | POA: Diagnosis not present

## 2015-08-11 DIAGNOSIS — M6281 Muscle weakness (generalized): Secondary | ICD-10-CM | POA: Diagnosis not present

## 2015-08-13 DIAGNOSIS — I10 Essential (primary) hypertension: Secondary | ICD-10-CM | POA: Diagnosis not present

## 2015-08-13 DIAGNOSIS — E46 Unspecified protein-calorie malnutrition: Secondary | ICD-10-CM | POA: Diagnosis not present

## 2015-08-13 DIAGNOSIS — R269 Unspecified abnormalities of gait and mobility: Secondary | ICD-10-CM | POA: Diagnosis not present

## 2015-08-13 DIAGNOSIS — F0391 Unspecified dementia with behavioral disturbance: Secondary | ICD-10-CM | POA: Diagnosis not present

## 2015-08-13 DIAGNOSIS — M6281 Muscle weakness (generalized): Secondary | ICD-10-CM | POA: Diagnosis not present

## 2015-08-14 DIAGNOSIS — E46 Unspecified protein-calorie malnutrition: Secondary | ICD-10-CM | POA: Diagnosis not present

## 2015-08-14 DIAGNOSIS — I10 Essential (primary) hypertension: Secondary | ICD-10-CM | POA: Diagnosis not present

## 2015-08-14 DIAGNOSIS — M6281 Muscle weakness (generalized): Secondary | ICD-10-CM | POA: Diagnosis not present

## 2015-08-14 DIAGNOSIS — F0391 Unspecified dementia with behavioral disturbance: Secondary | ICD-10-CM | POA: Diagnosis not present

## 2015-08-14 DIAGNOSIS — R269 Unspecified abnormalities of gait and mobility: Secondary | ICD-10-CM | POA: Diagnosis not present

## 2015-08-15 DIAGNOSIS — M6281 Muscle weakness (generalized): Secondary | ICD-10-CM | POA: Diagnosis not present

## 2015-08-15 DIAGNOSIS — R269 Unspecified abnormalities of gait and mobility: Secondary | ICD-10-CM | POA: Diagnosis not present

## 2015-08-15 DIAGNOSIS — E46 Unspecified protein-calorie malnutrition: Secondary | ICD-10-CM | POA: Diagnosis not present

## 2015-08-15 DIAGNOSIS — F0391 Unspecified dementia with behavioral disturbance: Secondary | ICD-10-CM | POA: Diagnosis not present

## 2015-08-15 DIAGNOSIS — I10 Essential (primary) hypertension: Secondary | ICD-10-CM | POA: Diagnosis not present

## 2015-08-16 DIAGNOSIS — M6281 Muscle weakness (generalized): Secondary | ICD-10-CM | POA: Diagnosis not present

## 2015-08-16 DIAGNOSIS — E46 Unspecified protein-calorie malnutrition: Secondary | ICD-10-CM | POA: Diagnosis not present

## 2015-08-16 DIAGNOSIS — I10 Essential (primary) hypertension: Secondary | ICD-10-CM | POA: Diagnosis not present

## 2015-08-16 DIAGNOSIS — F0391 Unspecified dementia with behavioral disturbance: Secondary | ICD-10-CM | POA: Diagnosis not present

## 2015-08-16 DIAGNOSIS — R269 Unspecified abnormalities of gait and mobility: Secondary | ICD-10-CM | POA: Diagnosis not present

## 2015-08-18 DIAGNOSIS — M6281 Muscle weakness (generalized): Secondary | ICD-10-CM | POA: Diagnosis not present

## 2015-08-18 DIAGNOSIS — F0391 Unspecified dementia with behavioral disturbance: Secondary | ICD-10-CM | POA: Diagnosis not present

## 2015-08-18 DIAGNOSIS — R269 Unspecified abnormalities of gait and mobility: Secondary | ICD-10-CM | POA: Diagnosis not present

## 2015-08-18 DIAGNOSIS — I10 Essential (primary) hypertension: Secondary | ICD-10-CM | POA: Diagnosis not present

## 2015-08-18 DIAGNOSIS — E46 Unspecified protein-calorie malnutrition: Secondary | ICD-10-CM | POA: Diagnosis not present

## 2015-08-19 DIAGNOSIS — E46 Unspecified protein-calorie malnutrition: Secondary | ICD-10-CM | POA: Diagnosis not present

## 2015-08-19 DIAGNOSIS — M6281 Muscle weakness (generalized): Secondary | ICD-10-CM | POA: Diagnosis not present

## 2015-08-19 DIAGNOSIS — F0391 Unspecified dementia with behavioral disturbance: Secondary | ICD-10-CM | POA: Diagnosis not present

## 2015-08-19 DIAGNOSIS — I10 Essential (primary) hypertension: Secondary | ICD-10-CM | POA: Diagnosis not present

## 2015-08-19 DIAGNOSIS — R269 Unspecified abnormalities of gait and mobility: Secondary | ICD-10-CM | POA: Diagnosis not present

## 2015-08-20 DIAGNOSIS — M6281 Muscle weakness (generalized): Secondary | ICD-10-CM | POA: Diagnosis not present

## 2015-08-20 DIAGNOSIS — R269 Unspecified abnormalities of gait and mobility: Secondary | ICD-10-CM | POA: Diagnosis not present

## 2015-08-20 DIAGNOSIS — I10 Essential (primary) hypertension: Secondary | ICD-10-CM | POA: Diagnosis not present

## 2015-08-20 DIAGNOSIS — E46 Unspecified protein-calorie malnutrition: Secondary | ICD-10-CM | POA: Diagnosis not present

## 2015-08-20 DIAGNOSIS — F0391 Unspecified dementia with behavioral disturbance: Secondary | ICD-10-CM | POA: Diagnosis not present

## 2015-08-21 DIAGNOSIS — I10 Essential (primary) hypertension: Secondary | ICD-10-CM | POA: Diagnosis not present

## 2015-08-21 DIAGNOSIS — M6281 Muscle weakness (generalized): Secondary | ICD-10-CM | POA: Diagnosis not present

## 2015-08-21 DIAGNOSIS — F0391 Unspecified dementia with behavioral disturbance: Secondary | ICD-10-CM | POA: Diagnosis not present

## 2015-08-21 DIAGNOSIS — E46 Unspecified protein-calorie malnutrition: Secondary | ICD-10-CM | POA: Diagnosis not present

## 2015-08-21 DIAGNOSIS — R269 Unspecified abnormalities of gait and mobility: Secondary | ICD-10-CM | POA: Diagnosis not present

## 2015-08-23 DIAGNOSIS — F0391 Unspecified dementia with behavioral disturbance: Secondary | ICD-10-CM | POA: Diagnosis not present

## 2015-08-23 DIAGNOSIS — R296 Repeated falls: Secondary | ICD-10-CM | POA: Diagnosis not present

## 2015-08-23 DIAGNOSIS — I10 Essential (primary) hypertension: Secondary | ICD-10-CM | POA: Diagnosis not present

## 2015-08-23 DIAGNOSIS — M6281 Muscle weakness (generalized): Secondary | ICD-10-CM | POA: Diagnosis not present

## 2015-08-23 DIAGNOSIS — E46 Unspecified protein-calorie malnutrition: Secondary | ICD-10-CM | POA: Diagnosis not present

## 2015-08-23 DIAGNOSIS — R269 Unspecified abnormalities of gait and mobility: Secondary | ICD-10-CM | POA: Diagnosis not present

## 2015-08-25 DIAGNOSIS — R269 Unspecified abnormalities of gait and mobility: Secondary | ICD-10-CM | POA: Diagnosis not present

## 2015-08-25 DIAGNOSIS — Z0279 Encounter for issue of other medical certificate: Secondary | ICD-10-CM

## 2015-08-25 DIAGNOSIS — M6281 Muscle weakness (generalized): Secondary | ICD-10-CM | POA: Diagnosis not present

## 2015-08-25 DIAGNOSIS — I10 Essential (primary) hypertension: Secondary | ICD-10-CM | POA: Diagnosis not present

## 2015-08-25 DIAGNOSIS — F0391 Unspecified dementia with behavioral disturbance: Secondary | ICD-10-CM | POA: Diagnosis not present

## 2015-08-25 DIAGNOSIS — E46 Unspecified protein-calorie malnutrition: Secondary | ICD-10-CM | POA: Diagnosis not present

## 2015-08-27 DIAGNOSIS — F0391 Unspecified dementia with behavioral disturbance: Secondary | ICD-10-CM | POA: Diagnosis not present

## 2015-08-27 DIAGNOSIS — R269 Unspecified abnormalities of gait and mobility: Secondary | ICD-10-CM | POA: Diagnosis not present

## 2015-08-27 DIAGNOSIS — E46 Unspecified protein-calorie malnutrition: Secondary | ICD-10-CM | POA: Diagnosis not present

## 2015-08-27 DIAGNOSIS — M6281 Muscle weakness (generalized): Secondary | ICD-10-CM | POA: Diagnosis not present

## 2015-08-27 DIAGNOSIS — I10 Essential (primary) hypertension: Secondary | ICD-10-CM | POA: Diagnosis not present

## 2015-08-27 NOTE — Progress Notes (Unsigned)
   Subjective:    Patient ID: Cynthia Graham, female    DOB: 11/01/1917, 81 y.o.   MRN: [id number]  HPI    Review of Systems     Objective:   Physical Exam        Assessment & Plan:   

## 2015-08-28 DIAGNOSIS — I10 Essential (primary) hypertension: Secondary | ICD-10-CM | POA: Diagnosis not present

## 2015-08-28 DIAGNOSIS — R269 Unspecified abnormalities of gait and mobility: Secondary | ICD-10-CM | POA: Diagnosis not present

## 2015-08-28 DIAGNOSIS — F0391 Unspecified dementia with behavioral disturbance: Secondary | ICD-10-CM | POA: Diagnosis not present

## 2015-08-28 DIAGNOSIS — E46 Unspecified protein-calorie malnutrition: Secondary | ICD-10-CM | POA: Diagnosis not present

## 2015-08-28 DIAGNOSIS — M6281 Muscle weakness (generalized): Secondary | ICD-10-CM | POA: Diagnosis not present

## 2015-08-30 DIAGNOSIS — R269 Unspecified abnormalities of gait and mobility: Secondary | ICD-10-CM | POA: Diagnosis not present

## 2015-08-30 DIAGNOSIS — I10 Essential (primary) hypertension: Secondary | ICD-10-CM | POA: Diagnosis not present

## 2015-08-30 DIAGNOSIS — M6281 Muscle weakness (generalized): Secondary | ICD-10-CM | POA: Diagnosis not present

## 2015-08-30 DIAGNOSIS — E46 Unspecified protein-calorie malnutrition: Secondary | ICD-10-CM | POA: Diagnosis not present

## 2015-08-30 DIAGNOSIS — F0391 Unspecified dementia with behavioral disturbance: Secondary | ICD-10-CM | POA: Diagnosis not present

## 2015-09-02 DIAGNOSIS — M6281 Muscle weakness (generalized): Secondary | ICD-10-CM | POA: Diagnosis not present

## 2015-09-02 DIAGNOSIS — R269 Unspecified abnormalities of gait and mobility: Secondary | ICD-10-CM | POA: Diagnosis not present

## 2015-09-02 DIAGNOSIS — I10 Essential (primary) hypertension: Secondary | ICD-10-CM | POA: Diagnosis not present

## 2015-09-02 DIAGNOSIS — E46 Unspecified protein-calorie malnutrition: Secondary | ICD-10-CM | POA: Diagnosis not present

## 2015-09-02 DIAGNOSIS — F0391 Unspecified dementia with behavioral disturbance: Secondary | ICD-10-CM | POA: Diagnosis not present

## 2015-09-03 DIAGNOSIS — F0391 Unspecified dementia with behavioral disturbance: Secondary | ICD-10-CM | POA: Diagnosis not present

## 2015-09-03 DIAGNOSIS — I10 Essential (primary) hypertension: Secondary | ICD-10-CM | POA: Diagnosis not present

## 2015-09-03 DIAGNOSIS — R269 Unspecified abnormalities of gait and mobility: Secondary | ICD-10-CM | POA: Diagnosis not present

## 2015-09-03 DIAGNOSIS — E46 Unspecified protein-calorie malnutrition: Secondary | ICD-10-CM | POA: Diagnosis not present

## 2015-09-03 DIAGNOSIS — M6281 Muscle weakness (generalized): Secondary | ICD-10-CM | POA: Diagnosis not present

## 2015-09-04 DIAGNOSIS — E46 Unspecified protein-calorie malnutrition: Secondary | ICD-10-CM | POA: Diagnosis not present

## 2015-09-04 DIAGNOSIS — M6281 Muscle weakness (generalized): Secondary | ICD-10-CM | POA: Diagnosis not present

## 2015-09-04 DIAGNOSIS — F0391 Unspecified dementia with behavioral disturbance: Secondary | ICD-10-CM | POA: Diagnosis not present

## 2015-09-04 DIAGNOSIS — R269 Unspecified abnormalities of gait and mobility: Secondary | ICD-10-CM | POA: Diagnosis not present

## 2015-09-04 DIAGNOSIS — I10 Essential (primary) hypertension: Secondary | ICD-10-CM | POA: Diagnosis not present

## 2015-09-06 DIAGNOSIS — M6281 Muscle weakness (generalized): Secondary | ICD-10-CM | POA: Diagnosis not present

## 2015-09-06 DIAGNOSIS — R269 Unspecified abnormalities of gait and mobility: Secondary | ICD-10-CM | POA: Diagnosis not present

## 2015-09-06 DIAGNOSIS — I10 Essential (primary) hypertension: Secondary | ICD-10-CM | POA: Diagnosis not present

## 2015-09-06 DIAGNOSIS — F0391 Unspecified dementia with behavioral disturbance: Secondary | ICD-10-CM | POA: Diagnosis not present

## 2015-09-08 DIAGNOSIS — R269 Unspecified abnormalities of gait and mobility: Secondary | ICD-10-CM | POA: Diagnosis not present

## 2015-09-08 DIAGNOSIS — M6281 Muscle weakness (generalized): Secondary | ICD-10-CM | POA: Diagnosis not present

## 2015-09-08 DIAGNOSIS — F0391 Unspecified dementia with behavioral disturbance: Secondary | ICD-10-CM | POA: Diagnosis not present

## 2015-09-08 DIAGNOSIS — I10 Essential (primary) hypertension: Secondary | ICD-10-CM | POA: Diagnosis not present

## 2015-09-09 DIAGNOSIS — I10 Essential (primary) hypertension: Secondary | ICD-10-CM | POA: Diagnosis not present

## 2015-09-09 DIAGNOSIS — F0391 Unspecified dementia with behavioral disturbance: Secondary | ICD-10-CM | POA: Diagnosis not present

## 2015-09-09 DIAGNOSIS — R269 Unspecified abnormalities of gait and mobility: Secondary | ICD-10-CM | POA: Diagnosis not present

## 2015-09-09 DIAGNOSIS — M6281 Muscle weakness (generalized): Secondary | ICD-10-CM | POA: Diagnosis not present

## 2015-09-10 DIAGNOSIS — M6281 Muscle weakness (generalized): Secondary | ICD-10-CM | POA: Diagnosis not present

## 2015-09-10 DIAGNOSIS — R269 Unspecified abnormalities of gait and mobility: Secondary | ICD-10-CM | POA: Diagnosis not present

## 2015-09-10 DIAGNOSIS — F0391 Unspecified dementia with behavioral disturbance: Secondary | ICD-10-CM | POA: Diagnosis not present

## 2015-09-10 DIAGNOSIS — I10 Essential (primary) hypertension: Secondary | ICD-10-CM | POA: Diagnosis not present

## 2015-09-12 DIAGNOSIS — M6281 Muscle weakness (generalized): Secondary | ICD-10-CM | POA: Diagnosis not present

## 2015-09-12 DIAGNOSIS — R269 Unspecified abnormalities of gait and mobility: Secondary | ICD-10-CM | POA: Diagnosis not present

## 2015-09-12 DIAGNOSIS — F0391 Unspecified dementia with behavioral disturbance: Secondary | ICD-10-CM | POA: Diagnosis not present

## 2015-09-12 DIAGNOSIS — I10 Essential (primary) hypertension: Secondary | ICD-10-CM | POA: Diagnosis not present

## 2015-09-13 DIAGNOSIS — M6281 Muscle weakness (generalized): Secondary | ICD-10-CM | POA: Diagnosis not present

## 2015-09-13 DIAGNOSIS — I10 Essential (primary) hypertension: Secondary | ICD-10-CM | POA: Diagnosis not present

## 2015-09-13 DIAGNOSIS — F0391 Unspecified dementia with behavioral disturbance: Secondary | ICD-10-CM | POA: Diagnosis not present

## 2015-09-13 DIAGNOSIS — R269 Unspecified abnormalities of gait and mobility: Secondary | ICD-10-CM | POA: Diagnosis not present

## 2015-09-15 DIAGNOSIS — F0391 Unspecified dementia with behavioral disturbance: Secondary | ICD-10-CM

## 2015-09-15 DIAGNOSIS — M6281 Muscle weakness (generalized): Secondary | ICD-10-CM

## 2015-09-15 DIAGNOSIS — I10 Essential (primary) hypertension: Secondary | ICD-10-CM | POA: Diagnosis not present

## 2015-09-15 DIAGNOSIS — R269 Unspecified abnormalities of gait and mobility: Secondary | ICD-10-CM

## 2015-09-17 DIAGNOSIS — R269 Unspecified abnormalities of gait and mobility: Secondary | ICD-10-CM | POA: Diagnosis not present

## 2015-09-17 DIAGNOSIS — F0391 Unspecified dementia with behavioral disturbance: Secondary | ICD-10-CM | POA: Diagnosis not present

## 2015-09-17 DIAGNOSIS — M6281 Muscle weakness (generalized): Secondary | ICD-10-CM | POA: Diagnosis not present

## 2015-09-17 DIAGNOSIS — I10 Essential (primary) hypertension: Secondary | ICD-10-CM | POA: Diagnosis not present

## 2015-09-18 DIAGNOSIS — F0391 Unspecified dementia with behavioral disturbance: Secondary | ICD-10-CM | POA: Diagnosis not present

## 2015-09-18 DIAGNOSIS — R269 Unspecified abnormalities of gait and mobility: Secondary | ICD-10-CM | POA: Diagnosis not present

## 2015-09-18 DIAGNOSIS — I10 Essential (primary) hypertension: Secondary | ICD-10-CM | POA: Diagnosis not present

## 2015-09-18 DIAGNOSIS — M6281 Muscle weakness (generalized): Secondary | ICD-10-CM | POA: Diagnosis not present

## 2015-09-19 DIAGNOSIS — F0391 Unspecified dementia with behavioral disturbance: Secondary | ICD-10-CM | POA: Diagnosis not present

## 2015-09-19 DIAGNOSIS — M6281 Muscle weakness (generalized): Secondary | ICD-10-CM | POA: Diagnosis not present

## 2015-09-19 DIAGNOSIS — R269 Unspecified abnormalities of gait and mobility: Secondary | ICD-10-CM | POA: Diagnosis not present

## 2015-09-19 DIAGNOSIS — I10 Essential (primary) hypertension: Secondary | ICD-10-CM | POA: Diagnosis not present

## 2015-09-20 DIAGNOSIS — F0391 Unspecified dementia with behavioral disturbance: Secondary | ICD-10-CM | POA: Diagnosis not present

## 2015-09-20 DIAGNOSIS — I10 Essential (primary) hypertension: Secondary | ICD-10-CM | POA: Diagnosis not present

## 2015-09-20 DIAGNOSIS — R269 Unspecified abnormalities of gait and mobility: Secondary | ICD-10-CM | POA: Diagnosis not present

## 2015-09-20 DIAGNOSIS — M6281 Muscle weakness (generalized): Secondary | ICD-10-CM | POA: Diagnosis not present

## 2015-09-22 DIAGNOSIS — R269 Unspecified abnormalities of gait and mobility: Secondary | ICD-10-CM | POA: Diagnosis not present

## 2015-09-22 DIAGNOSIS — F0391 Unspecified dementia with behavioral disturbance: Secondary | ICD-10-CM | POA: Diagnosis not present

## 2015-09-22 DIAGNOSIS — M6281 Muscle weakness (generalized): Secondary | ICD-10-CM | POA: Diagnosis not present

## 2015-09-22 DIAGNOSIS — I10 Essential (primary) hypertension: Secondary | ICD-10-CM | POA: Diagnosis not present

## 2015-09-23 DIAGNOSIS — R296 Repeated falls: Secondary | ICD-10-CM | POA: Diagnosis not present

## 2015-09-24 DIAGNOSIS — M6281 Muscle weakness (generalized): Secondary | ICD-10-CM | POA: Diagnosis not present

## 2015-09-24 DIAGNOSIS — I10 Essential (primary) hypertension: Secondary | ICD-10-CM | POA: Diagnosis not present

## 2015-09-24 DIAGNOSIS — F0391 Unspecified dementia with behavioral disturbance: Secondary | ICD-10-CM | POA: Diagnosis not present

## 2015-09-24 DIAGNOSIS — R269 Unspecified abnormalities of gait and mobility: Secondary | ICD-10-CM | POA: Diagnosis not present

## 2015-09-25 DIAGNOSIS — M6281 Muscle weakness (generalized): Secondary | ICD-10-CM | POA: Diagnosis not present

## 2015-09-25 DIAGNOSIS — R269 Unspecified abnormalities of gait and mobility: Secondary | ICD-10-CM | POA: Diagnosis not present

## 2015-09-25 DIAGNOSIS — F0391 Unspecified dementia with behavioral disturbance: Secondary | ICD-10-CM | POA: Diagnosis not present

## 2015-09-25 DIAGNOSIS — I10 Essential (primary) hypertension: Secondary | ICD-10-CM | POA: Diagnosis not present

## 2015-09-26 DIAGNOSIS — F0391 Unspecified dementia with behavioral disturbance: Secondary | ICD-10-CM | POA: Diagnosis not present

## 2015-09-26 DIAGNOSIS — I10 Essential (primary) hypertension: Secondary | ICD-10-CM | POA: Diagnosis not present

## 2015-09-26 DIAGNOSIS — M6281 Muscle weakness (generalized): Secondary | ICD-10-CM | POA: Diagnosis not present

## 2015-09-26 DIAGNOSIS — R269 Unspecified abnormalities of gait and mobility: Secondary | ICD-10-CM | POA: Diagnosis not present

## 2015-09-27 DIAGNOSIS — M6281 Muscle weakness (generalized): Secondary | ICD-10-CM | POA: Diagnosis not present

## 2015-09-27 DIAGNOSIS — I10 Essential (primary) hypertension: Secondary | ICD-10-CM | POA: Diagnosis not present

## 2015-09-27 DIAGNOSIS — R269 Unspecified abnormalities of gait and mobility: Secondary | ICD-10-CM | POA: Diagnosis not present

## 2015-09-27 DIAGNOSIS — F0391 Unspecified dementia with behavioral disturbance: Secondary | ICD-10-CM | POA: Diagnosis not present

## 2015-09-29 DIAGNOSIS — F0391 Unspecified dementia with behavioral disturbance: Secondary | ICD-10-CM | POA: Diagnosis not present

## 2015-09-29 DIAGNOSIS — I10 Essential (primary) hypertension: Secondary | ICD-10-CM | POA: Diagnosis not present

## 2015-09-29 DIAGNOSIS — M6281 Muscle weakness (generalized): Secondary | ICD-10-CM | POA: Diagnosis not present

## 2015-09-29 DIAGNOSIS — R269 Unspecified abnormalities of gait and mobility: Secondary | ICD-10-CM | POA: Diagnosis not present

## 2015-09-30 DIAGNOSIS — F0391 Unspecified dementia with behavioral disturbance: Secondary | ICD-10-CM | POA: Diagnosis not present

## 2015-09-30 DIAGNOSIS — R269 Unspecified abnormalities of gait and mobility: Secondary | ICD-10-CM | POA: Diagnosis not present

## 2015-09-30 DIAGNOSIS — M6281 Muscle weakness (generalized): Secondary | ICD-10-CM | POA: Diagnosis not present

## 2015-09-30 DIAGNOSIS — I10 Essential (primary) hypertension: Secondary | ICD-10-CM | POA: Diagnosis not present

## 2015-10-01 DIAGNOSIS — F0391 Unspecified dementia with behavioral disturbance: Secondary | ICD-10-CM | POA: Diagnosis not present

## 2015-10-01 DIAGNOSIS — M6281 Muscle weakness (generalized): Secondary | ICD-10-CM | POA: Diagnosis not present

## 2015-10-01 DIAGNOSIS — R269 Unspecified abnormalities of gait and mobility: Secondary | ICD-10-CM | POA: Diagnosis not present

## 2015-10-01 DIAGNOSIS — I10 Essential (primary) hypertension: Secondary | ICD-10-CM | POA: Diagnosis not present

## 2015-10-02 DIAGNOSIS — M6281 Muscle weakness (generalized): Secondary | ICD-10-CM | POA: Diagnosis not present

## 2015-10-02 DIAGNOSIS — R269 Unspecified abnormalities of gait and mobility: Secondary | ICD-10-CM | POA: Diagnosis not present

## 2015-10-02 DIAGNOSIS — F0391 Unspecified dementia with behavioral disturbance: Secondary | ICD-10-CM | POA: Diagnosis not present

## 2015-10-02 DIAGNOSIS — I10 Essential (primary) hypertension: Secondary | ICD-10-CM | POA: Diagnosis not present

## 2015-10-03 DIAGNOSIS — R269 Unspecified abnormalities of gait and mobility: Secondary | ICD-10-CM | POA: Diagnosis not present

## 2015-10-03 DIAGNOSIS — M6281 Muscle weakness (generalized): Secondary | ICD-10-CM | POA: Diagnosis not present

## 2015-10-03 DIAGNOSIS — I10 Essential (primary) hypertension: Secondary | ICD-10-CM | POA: Diagnosis not present

## 2015-10-03 DIAGNOSIS — F0391 Unspecified dementia with behavioral disturbance: Secondary | ICD-10-CM | POA: Diagnosis not present

## 2015-10-04 DIAGNOSIS — M6281 Muscle weakness (generalized): Secondary | ICD-10-CM | POA: Diagnosis not present

## 2015-10-04 DIAGNOSIS — I10 Essential (primary) hypertension: Secondary | ICD-10-CM | POA: Diagnosis not present

## 2015-10-04 DIAGNOSIS — F0391 Unspecified dementia with behavioral disturbance: Secondary | ICD-10-CM | POA: Diagnosis not present

## 2015-10-04 DIAGNOSIS — R269 Unspecified abnormalities of gait and mobility: Secondary | ICD-10-CM | POA: Diagnosis not present

## 2015-10-06 DIAGNOSIS — R269 Unspecified abnormalities of gait and mobility: Secondary | ICD-10-CM | POA: Diagnosis not present

## 2015-10-06 DIAGNOSIS — I10 Essential (primary) hypertension: Secondary | ICD-10-CM | POA: Diagnosis not present

## 2015-10-06 DIAGNOSIS — F0391 Unspecified dementia with behavioral disturbance: Secondary | ICD-10-CM | POA: Diagnosis not present

## 2015-10-06 DIAGNOSIS — M6281 Muscle weakness (generalized): Secondary | ICD-10-CM | POA: Diagnosis not present

## 2015-10-08 DIAGNOSIS — I10 Essential (primary) hypertension: Secondary | ICD-10-CM | POA: Diagnosis not present

## 2015-10-08 DIAGNOSIS — R269 Unspecified abnormalities of gait and mobility: Secondary | ICD-10-CM | POA: Diagnosis not present

## 2015-10-08 DIAGNOSIS — M6281 Muscle weakness (generalized): Secondary | ICD-10-CM | POA: Diagnosis not present

## 2015-10-08 DIAGNOSIS — F0391 Unspecified dementia with behavioral disturbance: Secondary | ICD-10-CM | POA: Diagnosis not present

## 2015-10-09 DIAGNOSIS — F0391 Unspecified dementia with behavioral disturbance: Secondary | ICD-10-CM | POA: Diagnosis not present

## 2015-10-09 DIAGNOSIS — I10 Essential (primary) hypertension: Secondary | ICD-10-CM | POA: Diagnosis not present

## 2015-10-09 DIAGNOSIS — M6281 Muscle weakness (generalized): Secondary | ICD-10-CM | POA: Diagnosis not present

## 2015-10-09 DIAGNOSIS — R269 Unspecified abnormalities of gait and mobility: Secondary | ICD-10-CM | POA: Diagnosis not present

## 2015-10-10 DIAGNOSIS — M6281 Muscle weakness (generalized): Secondary | ICD-10-CM | POA: Diagnosis not present

## 2015-10-10 DIAGNOSIS — F0391 Unspecified dementia with behavioral disturbance: Secondary | ICD-10-CM | POA: Diagnosis not present

## 2015-10-10 DIAGNOSIS — I10 Essential (primary) hypertension: Secondary | ICD-10-CM | POA: Diagnosis not present

## 2015-10-10 DIAGNOSIS — R269 Unspecified abnormalities of gait and mobility: Secondary | ICD-10-CM | POA: Diagnosis not present

## 2015-10-11 DIAGNOSIS — R269 Unspecified abnormalities of gait and mobility: Secondary | ICD-10-CM | POA: Diagnosis not present

## 2015-10-11 DIAGNOSIS — M6281 Muscle weakness (generalized): Secondary | ICD-10-CM | POA: Diagnosis not present

## 2015-10-11 DIAGNOSIS — F0391 Unspecified dementia with behavioral disturbance: Secondary | ICD-10-CM | POA: Diagnosis not present

## 2015-10-11 DIAGNOSIS — I10 Essential (primary) hypertension: Secondary | ICD-10-CM | POA: Diagnosis not present

## 2015-10-13 DIAGNOSIS — I10 Essential (primary) hypertension: Secondary | ICD-10-CM | POA: Diagnosis not present

## 2015-10-13 DIAGNOSIS — F0391 Unspecified dementia with behavioral disturbance: Secondary | ICD-10-CM | POA: Diagnosis not present

## 2015-10-13 DIAGNOSIS — R269 Unspecified abnormalities of gait and mobility: Secondary | ICD-10-CM | POA: Diagnosis not present

## 2015-10-13 DIAGNOSIS — M6281 Muscle weakness (generalized): Secondary | ICD-10-CM | POA: Diagnosis not present

## 2015-10-15 DIAGNOSIS — M6281 Muscle weakness (generalized): Secondary | ICD-10-CM | POA: Diagnosis not present

## 2015-10-15 DIAGNOSIS — I10 Essential (primary) hypertension: Secondary | ICD-10-CM | POA: Diagnosis not present

## 2015-10-15 DIAGNOSIS — R269 Unspecified abnormalities of gait and mobility: Secondary | ICD-10-CM | POA: Diagnosis not present

## 2015-10-15 DIAGNOSIS — F0391 Unspecified dementia with behavioral disturbance: Secondary | ICD-10-CM | POA: Diagnosis not present

## 2015-10-16 DIAGNOSIS — M6281 Muscle weakness (generalized): Secondary | ICD-10-CM | POA: Diagnosis not present

## 2015-10-16 DIAGNOSIS — I10 Essential (primary) hypertension: Secondary | ICD-10-CM | POA: Diagnosis not present

## 2015-10-16 DIAGNOSIS — R269 Unspecified abnormalities of gait and mobility: Secondary | ICD-10-CM | POA: Diagnosis not present

## 2015-10-16 DIAGNOSIS — F0391 Unspecified dementia with behavioral disturbance: Secondary | ICD-10-CM | POA: Diagnosis not present

## 2015-10-17 DIAGNOSIS — M6281 Muscle weakness (generalized): Secondary | ICD-10-CM | POA: Diagnosis not present

## 2015-10-17 DIAGNOSIS — F0391 Unspecified dementia with behavioral disturbance: Secondary | ICD-10-CM | POA: Diagnosis not present

## 2015-10-17 DIAGNOSIS — I10 Essential (primary) hypertension: Secondary | ICD-10-CM | POA: Diagnosis not present

## 2015-10-17 DIAGNOSIS — R269 Unspecified abnormalities of gait and mobility: Secondary | ICD-10-CM | POA: Diagnosis not present

## 2015-10-20 DIAGNOSIS — I10 Essential (primary) hypertension: Secondary | ICD-10-CM | POA: Diagnosis not present

## 2015-10-20 DIAGNOSIS — F0391 Unspecified dementia with behavioral disturbance: Secondary | ICD-10-CM | POA: Diagnosis not present

## 2015-10-20 DIAGNOSIS — M6281 Muscle weakness (generalized): Secondary | ICD-10-CM | POA: Diagnosis not present

## 2015-10-20 DIAGNOSIS — R269 Unspecified abnormalities of gait and mobility: Secondary | ICD-10-CM | POA: Diagnosis not present

## 2015-10-22 DIAGNOSIS — I10 Essential (primary) hypertension: Secondary | ICD-10-CM | POA: Diagnosis not present

## 2015-10-22 DIAGNOSIS — M6281 Muscle weakness (generalized): Secondary | ICD-10-CM | POA: Diagnosis not present

## 2015-10-22 DIAGNOSIS — F0391 Unspecified dementia with behavioral disturbance: Secondary | ICD-10-CM | POA: Diagnosis not present

## 2015-10-22 DIAGNOSIS — R269 Unspecified abnormalities of gait and mobility: Secondary | ICD-10-CM | POA: Diagnosis not present

## 2015-10-23 DIAGNOSIS — R296 Repeated falls: Secondary | ICD-10-CM | POA: Diagnosis not present

## 2015-10-24 DIAGNOSIS — M6281 Muscle weakness (generalized): Secondary | ICD-10-CM | POA: Diagnosis not present

## 2015-10-24 DIAGNOSIS — F0391 Unspecified dementia with behavioral disturbance: Secondary | ICD-10-CM | POA: Diagnosis not present

## 2015-10-24 DIAGNOSIS — I10 Essential (primary) hypertension: Secondary | ICD-10-CM | POA: Diagnosis not present

## 2015-10-24 DIAGNOSIS — R269 Unspecified abnormalities of gait and mobility: Secondary | ICD-10-CM | POA: Diagnosis not present

## 2015-10-27 DIAGNOSIS — R269 Unspecified abnormalities of gait and mobility: Secondary | ICD-10-CM | POA: Diagnosis not present

## 2015-10-27 DIAGNOSIS — M6281 Muscle weakness (generalized): Secondary | ICD-10-CM | POA: Diagnosis not present

## 2015-10-27 DIAGNOSIS — F0391 Unspecified dementia with behavioral disturbance: Secondary | ICD-10-CM | POA: Diagnosis not present

## 2015-10-27 DIAGNOSIS — I10 Essential (primary) hypertension: Secondary | ICD-10-CM | POA: Diagnosis not present

## 2015-10-28 DIAGNOSIS — F0391 Unspecified dementia with behavioral disturbance: Secondary | ICD-10-CM | POA: Diagnosis not present

## 2015-10-28 DIAGNOSIS — M6281 Muscle weakness (generalized): Secondary | ICD-10-CM | POA: Diagnosis not present

## 2015-10-28 DIAGNOSIS — I10 Essential (primary) hypertension: Secondary | ICD-10-CM | POA: Diagnosis not present

## 2015-10-28 DIAGNOSIS — R269 Unspecified abnormalities of gait and mobility: Secondary | ICD-10-CM | POA: Diagnosis not present

## 2015-10-29 DIAGNOSIS — F0391 Unspecified dementia with behavioral disturbance: Secondary | ICD-10-CM | POA: Diagnosis not present

## 2015-10-29 DIAGNOSIS — R269 Unspecified abnormalities of gait and mobility: Secondary | ICD-10-CM | POA: Diagnosis not present

## 2015-10-29 DIAGNOSIS — I10 Essential (primary) hypertension: Secondary | ICD-10-CM | POA: Diagnosis not present

## 2015-10-29 DIAGNOSIS — M6281 Muscle weakness (generalized): Secondary | ICD-10-CM | POA: Diagnosis not present

## 2015-10-30 DIAGNOSIS — I10 Essential (primary) hypertension: Secondary | ICD-10-CM | POA: Diagnosis not present

## 2015-10-30 DIAGNOSIS — M6281 Muscle weakness (generalized): Secondary | ICD-10-CM | POA: Diagnosis not present

## 2015-10-30 DIAGNOSIS — R269 Unspecified abnormalities of gait and mobility: Secondary | ICD-10-CM | POA: Diagnosis not present

## 2015-10-30 DIAGNOSIS — F0391 Unspecified dementia with behavioral disturbance: Secondary | ICD-10-CM | POA: Diagnosis not present

## 2015-10-31 DIAGNOSIS — R269 Unspecified abnormalities of gait and mobility: Secondary | ICD-10-CM | POA: Diagnosis not present

## 2015-10-31 DIAGNOSIS — F0391 Unspecified dementia with behavioral disturbance: Secondary | ICD-10-CM | POA: Diagnosis not present

## 2015-10-31 DIAGNOSIS — M6281 Muscle weakness (generalized): Secondary | ICD-10-CM | POA: Diagnosis not present

## 2015-10-31 DIAGNOSIS — I10 Essential (primary) hypertension: Secondary | ICD-10-CM | POA: Diagnosis not present

## 2015-11-04 DIAGNOSIS — R269 Unspecified abnormalities of gait and mobility: Secondary | ICD-10-CM | POA: Diagnosis not present

## 2015-11-04 DIAGNOSIS — I10 Essential (primary) hypertension: Secondary | ICD-10-CM | POA: Diagnosis not present

## 2015-11-04 DIAGNOSIS — F0391 Unspecified dementia with behavioral disturbance: Secondary | ICD-10-CM | POA: Diagnosis not present

## 2015-11-04 DIAGNOSIS — M6281 Muscle weakness (generalized): Secondary | ICD-10-CM | POA: Diagnosis not present

## 2015-11-23 DIAGNOSIS — R296 Repeated falls: Secondary | ICD-10-CM | POA: Diagnosis not present

## 2015-12-03 ENCOUNTER — Telehealth: Payer: Self-pay | Admitting: Internal Medicine

## 2015-12-03 NOTE — Telephone Encounter (Signed)
Daughter is concerned about her mothers newest bedsore. Has two that are healing but daughter discovered a new one today. They have an appointment to see you on 12/14 but would need non emergent transportation to bring her in as [name] cannot get her out of bed now. They checked into cost and it ranges from $300- $2000 to arrange transport b/c her insurance will not cover it. Daughter would like to know if you would consider setting up home health/nursing to eval the bedsore so that they can avoid that cost if possible?

## 2015-12-04 NOTE — Telephone Encounter (Signed)
Yes, home health to evaluate and treat

## 2015-12-05 ENCOUNTER — Other Ambulatory Visit: Payer: Self-pay | Admitting: Internal Medicine

## 2015-12-05 ENCOUNTER — Other Ambulatory Visit: Payer: Self-pay | Admitting: *Deleted

## 2015-12-05 DIAGNOSIS — L8990 Pressure ulcer of unspecified site, unspecified stage: Secondary | ICD-10-CM

## 2015-12-05 MED ORDER — LORAZEPAM 0.5 MG PO TABS: 0.5000 mg | ORAL_TABLET | Freq: Three times a day (TID) | ORAL | Status: AC | PRN

## 2015-12-05 MED ORDER — METOPROLOL TARTRATE 50 MG PO TABS: 50.0000 mg | ORAL_TABLET | Freq: Two times a day (BID) | ORAL | Status: AC

## 2015-12-05 MED ORDER — BENAZEPRIL HCL 40 MG PO TABS: 40.0000 mg | ORAL_TABLET | Freq: Every morning | ORAL | Status: AC

## 2015-12-05 MED ORDER — AMLODIPINE BESYLATE 5 MG PO TABS: ORAL_TABLET | ORAL | Status: AC

## 2015-12-05 NOTE — Telephone Encounter (Signed)
Referral has been set up for home health. Spoke with daughter and she is aware to await their phone call once set up.

## 2015-12-05 NOTE — Telephone Encounter (Signed)
Rx refill request from daughter approved by Dr. Maggie SchwalbeKwiatkoski:   Lorazepam Amlodipine Metoprolol Benazepril   -BP medications sent in [facility]

## 2015-12-08 ENCOUNTER — Telehealth: Payer: Self-pay | Admitting: Internal Medicine

## 2015-12-08 DIAGNOSIS — F0391 Unspecified dementia with behavioral disturbance: Secondary | ICD-10-CM

## 2015-12-08 DIAGNOSIS — F03918 Unspecified dementia, unspecified severity, with other behavioral disturbance: Secondary | ICD-10-CM

## 2015-12-08 NOTE — Telephone Encounter (Signed)
Per Cay SchillingsKristy,RN patient either needs a visit with an MD within 90 days before or 30 days after home health can eval and treat patient.(Per CMS guidelines)   Unless a MD makes a house call they are not able assist with this patient.   RN suggests Hospice if you feel the patient meets the criteria but that is all they are able to offer at this time.

## 2015-12-08 NOTE — Telephone Encounter (Signed)
Referral was place

## 2015-12-08 NOTE — Telephone Encounter (Signed)
Hospice referral

## 2015-12-09 NOTE — Telephone Encounter (Signed)
Attempted to reach out to daughter to update about referral. Left message on answering machine to call office back

## 2015-12-10 ENCOUNTER — Ambulatory Visit: Payer: Self-pay | Admitting: Internal Medicine

## 2015-12-10 NOTE — Telephone Encounter (Signed)
Attempted again to updated family about hospice referral. Awaiting referral

## 2015-12-23 ENCOUNTER — Telehealth: Payer: Self-pay

## 2015-12-23 NOTE — Telephone Encounter (Signed)
Error wrong chart, pt has two MRN #'s.

## 2015-12-23 NOTE — Telephone Encounter (Signed)
PLEASE NOTE: All timestamps contained within this report are represented as [place]. CONFIDENTIALTY NOTICE: This fax transmission is intended only for the addressee. It contains information that is legally privileged, confidential or otherwise protected from use or disclosure. If you are not the intended recipient, you are strictly prohibited from reviewing, disclosing, copying using or disseminating any of this information or taking any action in reliance on or regarding this information. If you have received this fax in error, please notify us immediately by telephone so that we can arrange for its return to us. Phone: [phone], Toll-Free: [phone], Fax: [phone] Page: 1 of 1 Call Id: [id number] [facility] - Client TELEPHONE ADVICE RECORD [facility] Patient Name: Cynthia Graham Below Gender: Female DOB: 04/14/1917 Age: 81 Y 8 M 8 D Return Phone Number: Address: City/State/Zip: [place]  [facility] Client Client Site  [facility] - Night Physician Derryl HarborKwiatkowski, Pete Contact Type Call Caller Name sherry [phone] Caller Phone Number [phone] Relationship To Patient Care Giver Is this call to report lab results? No Call Type Page Only Initial Comment caller states family requests tx to [facility] for end of life care sherry - [phone] General Information Type Message Only Nurse Assessment Guidelines Guideline Title Affirmed Question Affirmed Notes Nurse Date/Time (Eastern Time) Disp. Time Lamount Cohen(Eastern Time) Disposition Final User 02-May-2015 3:57:19 PM General Information Provided Yes Thurnell Garbeostley, Robert After Care Instructions Given Call Event Type User Date / Time Description

## 2015-12-23 NOTE — Telephone Encounter (Signed)
Was notified patient passed away 12-24-15.

## 2015-12-28 DEATH — deceased

## 2015-12-29 NOTE — Progress Notes (Unsigned)
   Subjective:    Patient ID: Cynthia Graham, female    DOB: [birth year], [age] y.o.   MRN: [id number]  HPI    Review of Systems     Objective:   Physical Exam        Assessment & Plan:
